# Patient Record
Sex: Female | Born: 1980 | Race: White | Hispanic: No | Marital: Married | Smoking: Never smoker
Health system: Southern US, Community
[De-identification: ages and names within clinical notes are randomized; demographics above are authoritative.]

## PROBLEM LIST (undated history)

## (undated) DIAGNOSIS — F419 Anxiety disorder, unspecified: Secondary | ICD-10-CM

## (undated) DIAGNOSIS — Z3490 Encounter for supervision of normal pregnancy, unspecified, unspecified trimester: Principal | ICD-10-CM

## (undated) DIAGNOSIS — I82819 Embolism and thrombosis of superficial veins of unspecified lower extremities: Secondary | ICD-10-CM

## (undated) DIAGNOSIS — N83209 Unspecified ovarian cyst, unspecified side: Secondary | ICD-10-CM

## (undated) DIAGNOSIS — R87619 Unspecified abnormal cytological findings in specimens from cervix uteri: Secondary | ICD-10-CM

## (undated) DIAGNOSIS — Z87440 Personal history of urinary (tract) infections: Secondary | ICD-10-CM

## (undated) DIAGNOSIS — IMO0002 Reserved for concepts with insufficient information to code with codable children: Secondary | ICD-10-CM

## (undated) DIAGNOSIS — O209 Hemorrhage in early pregnancy, unspecified: Secondary | ICD-10-CM

## (undated) DIAGNOSIS — N939 Abnormal uterine and vaginal bleeding, unspecified: Principal | ICD-10-CM

## (undated) DIAGNOSIS — N926 Irregular menstruation, unspecified: Secondary | ICD-10-CM

## (undated) HISTORY — DX: Personal history of urinary (tract) infections: Z87.440

## (undated) HISTORY — DX: Encounter for supervision of normal pregnancy, unspecified, unspecified trimester: Z34.90

## (undated) HISTORY — PX: CRYOTHERAPY: SHX1416

## (undated) HISTORY — DX: Unspecified abnormal cytological findings in specimens from cervix uteri: R87.619

## (undated) HISTORY — DX: Irregular menstruation, unspecified: N92.6

## (undated) HISTORY — DX: Embolism and thrombosis of superficial veins of unspecified lower extremity: I82.819

## (undated) HISTORY — DX: Abnormal uterine and vaginal bleeding, unspecified: N93.9

## (undated) HISTORY — DX: Anxiety disorder, unspecified: F41.9

## (undated) HISTORY — DX: Reserved for concepts with insufficient information to code with codable children: IMO0002

## (undated) HISTORY — DX: Hemorrhage in early pregnancy, unspecified: O20.9

## (undated) HISTORY — DX: Unspecified ovarian cyst, unspecified side: N83.209

## (undated) HISTORY — PX: TUBAL LIGATION: SHX77

---

## 2001-01-23 ENCOUNTER — Other Ambulatory Visit: Admission: RE | Admit: 2001-01-23 | Discharge: 2001-01-23 | Payer: Self-pay | Admitting: Obstetrics and Gynecology

## 2001-03-02 ENCOUNTER — Emergency Department (HOSPITAL_COMMUNITY): Admission: EM | Admit: 2001-03-02 | Discharge: 2001-03-02 | Payer: Self-pay | Admitting: Emergency Medicine

## 2001-12-28 ENCOUNTER — Ambulatory Visit (HOSPITAL_COMMUNITY): Admission: RE | Admit: 2001-12-28 | Discharge: 2001-12-28 | Payer: Self-pay | Admitting: Pulmonary Disease

## 2002-07-03 ENCOUNTER — Emergency Department (HOSPITAL_COMMUNITY): Admission: EM | Admit: 2002-07-03 | Discharge: 2002-07-03 | Payer: Self-pay | Admitting: Internal Medicine

## 2002-08-05 ENCOUNTER — Emergency Department (HOSPITAL_COMMUNITY): Admission: EM | Admit: 2002-08-05 | Discharge: 2002-08-05 | Payer: Self-pay | Admitting: *Deleted

## 2003-01-25 ENCOUNTER — Ambulatory Visit (HOSPITAL_COMMUNITY): Admission: RE | Admit: 2003-01-25 | Discharge: 2003-01-25 | Payer: Self-pay | Admitting: Obstetrics & Gynecology

## 2003-02-12 HISTORY — PX: DILATION AND CURETTAGE OF UTERUS: SHX78

## 2003-07-24 ENCOUNTER — Emergency Department (HOSPITAL_COMMUNITY): Admission: EM | Admit: 2003-07-24 | Discharge: 2003-07-24 | Payer: Self-pay | Admitting: Emergency Medicine

## 2003-07-25 ENCOUNTER — Other Ambulatory Visit: Admission: RE | Admit: 2003-07-25 | Discharge: 2003-07-25 | Payer: Self-pay | Admitting: Obstetrics & Gynecology

## 2004-04-30 ENCOUNTER — Ambulatory Visit (HOSPITAL_COMMUNITY): Admission: AD | Admit: 2004-04-30 | Discharge: 2004-04-30 | Payer: Self-pay | Admitting: Obstetrics and Gynecology

## 2004-06-01 ENCOUNTER — Ambulatory Visit (HOSPITAL_COMMUNITY): Admission: AD | Admit: 2004-06-01 | Discharge: 2004-06-01 | Payer: Self-pay | Admitting: Obstetrics and Gynecology

## 2004-07-03 ENCOUNTER — Observation Stay (HOSPITAL_COMMUNITY): Admission: AD | Admit: 2004-07-03 | Discharge: 2004-07-04 | Payer: Self-pay | Admitting: Obstetrics & Gynecology

## 2004-07-11 ENCOUNTER — Inpatient Hospital Stay (HOSPITAL_COMMUNITY): Admission: RE | Admit: 2004-07-11 | Discharge: 2004-07-13 | Payer: Self-pay | Admitting: Obstetrics & Gynecology

## 2009-12-15 ENCOUNTER — Other Ambulatory Visit: Admission: RE | Admit: 2009-12-15 | Discharge: 2009-12-15 | Payer: Self-pay | Admitting: Obstetrics and Gynecology

## 2010-02-11 DIAGNOSIS — N83209 Unspecified ovarian cyst, unspecified side: Secondary | ICD-10-CM

## 2010-02-11 HISTORY — DX: Unspecified ovarian cyst, unspecified side: N83.209

## 2010-05-24 ENCOUNTER — Other Ambulatory Visit (HOSPITAL_COMMUNITY)
Admission: RE | Admit: 2010-05-24 | Discharge: 2010-05-24 | Disposition: A | Discharge status: Home / Self Care | Payer: 59 | Source: Ambulatory Visit | Attending: Obstetrics and Gynecology | Admitting: Obstetrics and Gynecology

## 2010-05-24 DIAGNOSIS — Z01419 Encounter for gynecological examination (general) (routine) without abnormal findings: Secondary | ICD-10-CM | POA: Insufficient documentation

## 2010-06-29 NOTE — H&P (Signed)
NAME:  Kristin Buckley, Kristin Buckley                         ACCOUNT NO.:  [id number]   MEDICAL RECORD NO.:  [id number]                  PATIENT TYPE:   LOCATION:                                       FACILITY:   PHYSICIAN:  Lazaro Arms, M.D.                DATE OF BIRTH:  1980-07-01   DATE OF ADMISSION:  DATE OF DISCHARGE:                                HISTORY & PHYSICAL   HISTORY OF PRESENT ILLNESS:  Kristin Buckley is a 30 year old white female, now  gravida 2, para 0, abortus 1 with a missed AB diagnosed by sonogram and  following HCGs at approximately 7-1/2 weeks.  Kristin Buckley pregnancy had been  relatively uncomplicated to this point.  We had her on progesterone  suppositories for borderline progesterone level.  She came in with complaint  of just a minor amount of spotting on Friday.  Vaginal ultrasound revealed a  7-week 5-day crown/rump length with no fetal cardiac activity.  I got HCGs  on December 10th and 13th, and they went from 40,498 to just over 31,000.  As a result, this represents a missed AB. Kristin Buckley has not had any cramping or  bleeding since.   PAST MEDICAL HISTORY:  __________ medical problems negative.   PAST SURGICAL HISTORY:  Negative.   PAST OBSTETRICAL HISTORY:  She had an early first trimester loss earlier  this year.   REVIEW OF SYSTEMS:  Otherwise negative.   IMPRESSION:  First trimester missed abortion.   PLAN:  Cervical dilatation with suction and sharp curettage. The patient  understands the indications and will proceed.     ___________________________________________                                         Lazaro Arms, M.D.   [name]  D:  01/24/2003  T:  01/24/2003  Job:  [id number]

## 2010-06-29 NOTE — Op Note (Signed)
NAMESARRAH, FIORENZA NO.:  [id number]   MEDICAL RECORD NO.:  [id number]          PATIENT TYPE:  INP   LOCATION:  A402                          FACILITY:  [facility]   PHYSICIAN:  Lazaro Arms, M.D.   DATE OF BIRTH:  June 02, 1980   DATE OF PROCEDURE:  07/11/2004  DATE OF DISCHARGE:                                 OPERATIVE REPORT   PREOPERATIVE DIAGNOSES:  1.  Intrauterine pregnancy at 37-5/[redacted] weeks gestation.  2.  Double footling breech.  3.  Diminished amniotic fluid.   POSTOPERATIVE DIAGNOSES:  1.  Intrauterine pregnancy at 37-5/[redacted] weeks gestation.  2.  Double footling breech.  3.  Diminished amniotic fluid.   PROCEDURES:  Primary low transverse cesarean section with breech extraction.   SURGEON:  Lazaro Arms, M.D.   ANESTHESIA:  Spinal.   FINDINGS:  Over a low transverse hysterotomy incision was delivered a viable  female infant with Apgars of 9 and 9.  He was a double footling breech.  Great  care was taken with delivery of the pelvis, delivery of the shoulders and  delivery of the head to ensure no injury.  There was a three-vessel cord.  Cord blood and cord gas were sent.  The placenta was somewhat calcified.  It  was sent to pathology for evaluation.  The uterus, tubes and ovaries were  all normal.   DESCRIPTION OF PROCEDURE:  The patient was taken to the operating room and  placed in the sitting position.  She underwent a spinal anesthetic.  She was  placed in the supine position where she was prepped and draped in the usual  sterile fashion.  A Pfannenstiel skin incision was made and carried down  sharply to the rectus fascia, which was scored in the midline and extended  laterally.  The fascia was taken off of the muscle superiorly and inferiorly  without difficulty.  The muscles were divided.  The peritoneal cavity was  entered.  A bladder blade was placed.  A vesicouterine serosal flap was  created.  A low transverse hysterotomy incision was made.   Over this  incision was delivered a viable female, double footling breech extraction.  The legs were grasped at the ankles.  Fundal pressure was applied and  delivered to the pelvis.  I then pulled the baby by the pelvis around the  waist to the area of the shoulders.  The shoulders were then delivered by  flexing at the shoulder and elbow.  Then the head was delivered again by  flexion, by pressure and sort of flipping the baby.  The cord was clamped.  The infant was handed to Dr. Milford Cage, who was in attendance, for routine  neonatal resuscitation.  Cord blood and cord gas were sent.  The placenta  was delivered.  It was somewhat calcified and sent to pathology for  evaluation.  The uterus was exteriorized and closed in two layers, the first  being a running interlocking layer and the second being an imbricating  layer.  There was good hemostasis.  The uterus was replaced into the  peritoneal  cavity.  Both pericolic gutters were irrigated.  All pedicles  were hemostatic.  The muscles were reapproximated loosely.  The fascia was  closed using 0 Vicryl running  subcutaneous tissues, made hemostatic and irrigated.  The skin was closed  using skin staples.  The patient tolerated the procedure well.  She  experienced 750 mL of blood loss and was taken to recovery in good stable  condition.  All counts were correct.      LHE/MEDQ  D:  07/11/2004  T:  07/11/2004  Job:  [id number]

## 2010-06-29 NOTE — Op Note (Signed)
NAME:  Kristin Buckley, Kristin Buckley                         ACCOUNT NO.:  [id number]   MEDICAL RECORD NO.:  [id number]                   PATIENT TYPE:  AMB   LOCATION:  DAY                                  FACILITY:  [facility]   PHYSICIAN:  Lazaro Arms, M.D.                DATE OF BIRTH:  09-15-1980   DATE OF PROCEDURE:  01/25/2003  DATE OF DISCHARGE:                                 OPERATIVE REPORT   PREOPERATIVE DIAGNOSIS:  Missed abortion in the first trimester.   POSTOPERATIVE DIAGNOSIS:  Missed abortion in the first trimester.   PROCEDURE:  Cervical dilatation with a general suction and sharp uterine  curettage.   SURGEON:  Lazaro Arms, M.D.   ANESTHESIA:  Laryngeal mask airway.   FINDINGS:  The patient was found to have 7 week 5 day embryo with no fetal  cardiac activity and downward spiral hCG.  She was admitted for a D&C.   At the time of surgery, there were no unusual findings.   DESCRIPTION OF OPERATION:  The patient was taken to the operating room,  prepped and draped in the usual sterile fashion after being placed in  lithotomy position, 0.5% Marcaine was used a paracervical block.  Her cervix  was dilated serially to allow passage of an 8 French curved suction curette.  Three passes were made.  A gentle sharp curettage was performed.  One more  pass suction curette was performed.  No additional tissue was removed.  There was a good uterine cry in all areas.  The patient tolerated the  procedure well.  She was awakened from anesthesia and taken to the recovery  room in good stable condition.  All counts were correct.      ___________________________________________                                            Lazaro Arms, M.D.   LHE/MEDQ  D:  01/25/2003  T:  01/25/2003  Job:  [id number]

## 2010-06-29 NOTE — Discharge Summary (Signed)
Kristin Buckley, BANEY NO.:  [id number]   MEDICAL RECORD NO.:  [id number]          PATIENT TYPE:  INP   LOCATION:  A402                          FACILITY:  [facility]   PHYSICIAN:  Lazaro Arms, M.D.   DATE OF BIRTH:  01-18-1981   DATE OF ADMISSION:  07/11/2004  DATE OF DISCHARGE:  06/02/2006LH                                 DISCHARGE SUMMARY   DISCHARGE DIAGNOSES:  1.  Status post a primary cesarean section.  2.  Double Foley and breech.  3.  Unremarkable postoperative course.   PROCEDURES:  Primary C section with breech obstruction.   Please refer to the History and Physical, antepartum chart and operative  report for details of admission to the hospital.   HOSPITAL COURSE:  The patient was admitted after surgery.  She has done  quite well.  She was tolerated clear liquids and regular diet.  Voided  without symptoms.  She has been ambulatory and has return of normal bowel  function.  Her abdominal exam is benign.  Her incision is clean, dry and  intact.  She has remained afebrile, stable vital signs.  Her hemoglobin and  hematocrit on postoperative day #1 was 10 and 29 with a white count of 15.4.  Her incision is clean, dry and intact.  She is discharged to home to follow  up in the office next Tuesday to have her incision evaluated and staples  removed.  She was given Tylox and Motrin for pain and instructions and  precautions for return or contact in the office.      LHE/MEDQ  D:  07/13/2004  T:  07/13/2004  Job:  [id number]

## 2010-06-29 NOTE — Consult Note (Signed)
Kristin Buckley, Kristin Buckley NO.:  [id number]   MEDICAL RECORD NO.:  [id number]          PATIENT TYPE:  OBV   LOCATION:  LDR3                          FACILITY:  [facility]   PHYSICIAN:  Lazaro Arms, M.D.   DATE OF BIRTH:  July 18, 1980   DATE OF CONSULTATION:  DATE OF DISCHARGE:  07/04/2004                                   CONSULTATION   HISTORY OF PRESENT ILLNESS:  Kristin Buckley is a 30 year old white female, gravida 4,  para 0, abortus 3, with an estimated date of delivery of July 29, 2004,  currently at 36-3/[redacted] weeks gestation who was sent over to labor and delivery  because she was found to be breech in the office.  At the time she was found  to be breech, she had an amniotic fluid index of 6.3.  She was also  complaining of some blurred vision and increased swelling.  When she came to  labor and delivery, blood pressure was 151/75, and it quickly came down into  the 120s/70s to 80s range.  She had 2+ DTR's, no clonus, and she had 1+  edema.  Protein was negative in the office.  She was also complaining of  diminished fetal movement.  She came to labor and delivery, had a NST which  showed a reactive NST.  The patient was very concerned, very worried, and so  I decided to leave the patient overnight and do a biophysical profile the  next morning.  Biophysical profile, including the NST was 10/10, and the  amniotic fluid index was 7.3 cm.  Again, the baby was found to be breech,  and the blood pressure's came down even more to the next morning to the  120/70 range.  As a result, there is no evidence of any pre-eclampsia.  There is not oligohydramnios defined by an AFI of less than 5, or a vertical  pocket less than 2 cm.  We will probably plan to do a cesarean section next  week to prevent oligohydramnios from developing.  The placenta is also a  grade III by ultrasound.  The patient is discharged to home.  She is given  strict instructions and precautions, and will be seen later  this week to  have a followup NST.  At the present time, she is scheduled for cesarean  section next week for breech presentation.      LHE/MEDQ  D:  07/18/2004  T:  07/18/2004  Job:  [id number]

## 2011-04-16 ENCOUNTER — Other Ambulatory Visit: Payer: Self-pay

## 2011-04-16 ENCOUNTER — Encounter (HOSPITAL_COMMUNITY): Payer: Self-pay | Admitting: *Deleted

## 2011-04-16 ENCOUNTER — Emergency Department (HOSPITAL_COMMUNITY)
Admission: EM | Admit: 2011-04-16 | Discharge: 2011-04-16 | Disposition: A | Discharge status: Home / Self Care | Payer: 59 | Attending: Emergency Medicine | Admitting: Emergency Medicine

## 2011-04-16 DIAGNOSIS — K089 Disorder of teeth and supporting structures, unspecified: Secondary | ICD-10-CM | POA: Insufficient documentation

## 2011-04-16 DIAGNOSIS — M25519 Pain in unspecified shoulder: Secondary | ICD-10-CM | POA: Insufficient documentation

## 2011-04-16 DIAGNOSIS — F411 Generalized anxiety disorder: Secondary | ICD-10-CM | POA: Insufficient documentation

## 2011-04-16 DIAGNOSIS — R6884 Jaw pain: Secondary | ICD-10-CM | POA: Insufficient documentation

## 2011-04-16 DIAGNOSIS — K029 Dental caries, unspecified: Secondary | ICD-10-CM | POA: Insufficient documentation

## 2011-04-16 DIAGNOSIS — M79609 Pain in unspecified limb: Secondary | ICD-10-CM | POA: Insufficient documentation

## 2011-04-16 DIAGNOSIS — M25512 Pain in left shoulder: Secondary | ICD-10-CM

## 2011-04-16 DIAGNOSIS — R071 Chest pain on breathing: Secondary | ICD-10-CM | POA: Insufficient documentation

## 2011-04-16 MED ORDER — METHOCARBAMOL 500 MG PO TABS: ORAL_TABLET | ORAL | Status: AC

## 2011-04-16 MED ORDER — METHOCARBAMOL 500 MG PO TABS
1000.0000 mg | ORAL_TABLET | Freq: Once | ORAL | Status: DC
  Filled 2011-04-16: qty 2

## 2011-04-16 MED ORDER — PENICILLIN V POTASSIUM 250 MG PO TABS
500.0000 mg | ORAL_TABLET | Freq: Once | ORAL | Status: AC
  Administered 2011-04-16: 500 mg via ORAL
  Filled 2011-04-16: qty 2

## 2011-04-16 NOTE — ED Notes (Signed)
Pt is complaining of lt shoulder pain, lt lower jaw pain and lt ear ache.

## 2011-04-16 NOTE — ED Notes (Signed)
Pt reporting pain in left shoulder and arm, lasting 3 days.  Also reporting tooth on left lower side that she has been experiencing increased pain with.  Reports toothache is beginning to make jaw painful.

## 2011-04-16 NOTE — Discharge Instructions (Signed)
Continue the ibuprofen 600 mg every 6 hrs for pain with the robaxin. Keep your appointment with your dentis this morning at 9 am. He can decide if he wants you to remain on antibiotics. Try heat to relax your shoulder muscles (soak in a warm shower). Recheck as needed.

## 2011-04-16 NOTE — ED Notes (Signed)
Discharge instructions reviewed with pt, questions answered. Pt verbalized understanding.  

## 2011-04-16 NOTE — ED Provider Notes (Addendum)
History     CSN: [id number]  Arrival date & time 04/16/11  0200   First MD Initiated Contact with Patient 04/16/11 [phone]      Chief Complaint  Patient presents with  . Arm Pain  . Dental Pain    (Consider location/radiation/quality/duration/timing/severity/associated sxs/prior treatment) HPI  Patient relates her left shoulder has been aching for a few days. She denies any pain in her neck. She denies any injury that she is aware of. She states that it is an aching pain. She states it it seems to get worse at night. She states nothing makes it feel worse but stretching out her arm makes it feel better. She denies any numbness in her fingers. She states she's never had this before. She states nothing makes it feel better. She states stretching her arm out makes it feel better.She states she took 600 mg ibuprofen about 11 PM with minimal improvement. Patient denies shortness of breath or birth control pills.patient states she was lying in bed and got anxious as she was concerned with the jaw and arm pain that she was having a heart attack. She relates she was recently started on Cymbalta due to anxiety after her mother death in January 13, 2023.  Patient has a cavity on her left lower molar and she missed a dentist appointment last week because she was ill to have it fixed. She states yesterday she started having an achy underneath her left jaw. She states cold makes it feel worse. She denies any swelling of her gums or her jaw. She denies any fever. She has an appointment with  dentist at 9:00 this morning to get her tooth filled.  PCP Dr. Lilyan Punt  History reviewed. No pertinent past medical history.  Past Surgical History  Procedure Date  . Cesarean section     No family history on file.  History  Substance Use Topics  . Smoking status: Never Smoker   . Smokeless tobacco: Not on file  . Alcohol Use: No  employed as scrub tech  Lives with spouse  OB History    Grav Para Term  Preterm Abortions TAB SAB Ect Mult Living                  Review of Systems  All other systems reviewed and are negative.    Allergies  Review of patient's allergies indicates no known allergies.  Home Medications   Current Outpatient Rx  Name Route Sig Dispense Refill  . DULOXETINE HCL 20 MG PO CPEP Oral Take 20 mg by mouth daily.      BP 139/86  Pulse 71  Temp(Src) 98.7 F (37.1 C) (Oral)  Resp 20  Ht 5\' 11"  (1.803 m)  Wt 225 lb (102.059 kg)  BMI 31.38 kg/m2  SpO2 100%  LMP 04/16/2011  Vital signs normal    Physical Exam  Nursing note and vitals reviewed. Constitutional: She is oriented to person, place, and time. She appears well-developed and well-nourished.  Non-toxic appearance. She does not appear ill. No distress.  HENT:  Head: Normocephalic and atraumatic.  Right Ear: External ear normal.  Left Ear: External ear normal.  Nose: Nose normal. No mucosal edema or rhinorrhea.  Mouth/Throat: Oropharynx is clear and moist and mucous membranes are normal. No dental abscesses or uvula swelling.         She has a cavity on her left lower molar next to her wisdom tooth.  Eyes: Conjunctivae and EOM are normal. Pupils are equal, round, and  reactive to light.  Neck: Normal range of motion and full passive range of motion without pain. Neck supple.  Cardiovascular: Normal rate, regular rhythm and normal heart sounds.  Exam reveals no gallop and no friction rub.   No murmur heard. Pulmonary/Chest: Effort normal and breath sounds normal. No respiratory distress. She has no wheezes. She has no rhonchi. She has no rales. She exhibits no tenderness and no crepitus.  Abdominal: Soft. Normal appearance and bowel sounds are normal. She exhibits no distension. There is no tenderness. There is no rebound and no guarding.  Musculoskeletal: Normal range of motion. She exhibits no edema and no tenderness.       Moves all extremities well. Patient has no pain to abduction of her  left shoulder. She does not have a Phalen's or Tinel's sign. She does have some tenderness over the left upper trapezius and muscles in her left upper lateral chest wall.  Neurological: She is alert and oriented to person, place, and time. She has normal strength. No cranial nerve deficit.  Skin: Skin is warm, dry and intact. No rash noted. No erythema. No pallor.  Psychiatric: Her speech is normal and behavior is normal. Her mood appears not anxious.       Appears anxious    ED Course  Procedures (including critical care time)    Medications  methocarbamol (ROBAXIN) tablet 1,000 mg (not administered)  penicillin v potassium (VEETID) tablet 500 mg (500 mg Oral Given 04/16/11 0413)   Pt has a script for hydrocodone she hasn't filled. She also refuses tramadol. States she just wants to add the muscle relaxer to the ibuprofen.     Date: 04/16/2011  Rate: 69  Rhythm: normal sinus rhythm  QRS Axis: normal  Intervals: normal  ST/T Wave abnormalities: normal  Conduction Disutrbances:none  Narrative Interpretation:   Old EKG Reviewed: none available     1. Shoulder pain, left   2. Jaw pain   3. Dental caries     New Prescriptions   METHOCARBAMOL (ROBAXIN) 500 MG TABLET    Take 1 or 2 po QID for muscle soreness   Plan discharge Devoria Albe, MD, FACEP   MDM          Ward Givens, MD 04/16/11 4540  Ward Givens, MD 04/16/11 [phone]

## 2012-05-04 ENCOUNTER — Encounter: Payer: Self-pay | Admitting: Adult Health

## 2012-05-04 ENCOUNTER — Ambulatory Visit (INDEPENDENT_AMBULATORY_CARE_PROVIDER_SITE_OTHER): Payer: 59 | Admitting: Adult Health

## 2012-05-04 VITALS — BP 120/76 | Ht 71.0 in | Wt 230.0 lb

## 2012-05-04 DIAGNOSIS — R319 Hematuria, unspecified: Secondary | ICD-10-CM

## 2012-05-04 DIAGNOSIS — N390 Urinary tract infection, site not specified: Secondary | ICD-10-CM

## 2012-05-04 DIAGNOSIS — Z3202 Encounter for pregnancy test, result negative: Secondary | ICD-10-CM

## 2012-05-04 DIAGNOSIS — N926 Irregular menstruation, unspecified: Secondary | ICD-10-CM

## 2012-05-04 DIAGNOSIS — Z87898 Personal history of other specified conditions: Secondary | ICD-10-CM

## 2012-05-04 HISTORY — DX: Irregular menstruation, unspecified: N92.6

## 2012-05-04 LAB — POCT URINALYSIS DIPSTICK
Glucose, UA: NEGATIVE
Leukocytes, UA: NEGATIVE

## 2012-05-04 LAB — POCT URINE PREGNANCY: Preg Test, Ur: NEGATIVE

## 2012-05-04 MED ORDER — NITROFURANTOIN MONOHYD MACRO 100 MG PO CAPS: 100.0000 mg | ORAL_CAPSULE | Freq: Two times a day (BID) | ORAL | Status: DC

## 2012-05-04 NOTE — Progress Notes (Signed)
Subjective:     Patient ID: Kristin Buckley, female   DOB: [birth year], 32 y.o.   MRN: [id number]  HPI Kristin Buckley is a 32 year old white female, complaining of frequent urination, bloating and cramping, with a brown discharge x5 days. She says her periods have been irregular   Review of Systems Patient denies any headaches, blurred vision, shortness of breath, chest pain, abdominal pain, problems with bowel movements, or intercourse. She does have frequent urination and irregular periods.Denies any muscle pain. No depressed moods. Reviewed past medical, surgical social and family histories. Reviewed allergy and medication list.      Objective:   Physical ExamVital Signs: blood pressure 120/76, weight: 230lbs, Height: 5'11". LMP: 03/18/2012.Skin warm and dry, On pelvic exam external genitalia normal in appearance, the vagina is normal in appearance except scant brown discharge. Cervix is normal in apperance with negative CMT. Uterus felt to be normal size shape and contour, no adnexal masses or tenderness. Urine 1+ blood, negative urine pregnancy test.    Assessment:     Hematuria, UTI, Irregular menses.    Plan:     Prescribed Macrobid, increased fluids, period calendar, and call when ready to start trying to get pregnant.

## 2012-05-04 NOTE — Patient Instructions (Addendum)
Take macrobid, increase fluids, call me when ready to try to get pregnant, keep period calendar, sign up for mychart.

## 2012-05-06 ENCOUNTER — Telehealth: Payer: Self-pay | Admitting: Adult Health

## 2012-05-06 NOTE — Telephone Encounter (Signed)
Brownish discharge gone, feels crampy, try increasing fluids and call in am in follow up

## 2012-05-06 NOTE — Telephone Encounter (Signed)
Spoke with Pt. Was seen Monday and treated for UTI. On macrobid. Don't see any blood now in urine but feels crampy and bloated. +pressure. What do you advise?

## 2012-05-14 ENCOUNTER — Ambulatory Visit (INDEPENDENT_AMBULATORY_CARE_PROVIDER_SITE_OTHER): Payer: 59 | Admitting: Family Medicine

## 2012-05-14 ENCOUNTER — Encounter: Payer: Self-pay | Admitting: Family Medicine

## 2012-05-14 VITALS — BP 122/86 | HR 90 | Temp 99.0°F | Wt 232.0 lb

## 2012-05-14 DIAGNOSIS — L738 Other specified follicular disorders: Secondary | ICD-10-CM

## 2012-05-14 DIAGNOSIS — L739 Follicular disorder, unspecified: Secondary | ICD-10-CM

## 2012-05-14 MED ORDER — DOXYCYCLINE HYCLATE 100 MG PO CAPS: 100.0000 mg | ORAL_CAPSULE | Freq: Two times a day (BID) | ORAL | Status: DC

## 2012-05-14 MED ORDER — CLINDAMYCIN HCL 300 MG PO CAPS: 300.0000 mg | ORAL_CAPSULE | Freq: Three times a day (TID) | ORAL | Status: DC

## 2012-05-14 NOTE — Patient Instructions (Signed)
Use doxy twice a day Uses doxycycline twice a day. Take with a snack drink a tall glass of water afterwards. Also may use warm compresses several times a day to try to help speed the resolution. If any of these areas become an abscess he would need to call me. At this point in time there is nothing else that needs to be done.

## 2012-05-14 NOTE — Progress Notes (Signed)
  Subjective:    Patient ID: Kristin Buckley, female    DOB: March 26, 1980, 32 y.o.   MRN: [id number]  HPI patient is a rash underneath both arms. Patient denies any fever she has a small bump on her right arm that feels a little bit sore no other particular problems. There has been a family history of MRSA.    Review of Systemssee above.     Objective:   Physical Exam  Vital signs stable, folliculitis underneath both arms a small soft tissue nodule under the right arm no sign of abscess      Assessment & Plan:  Folliculitis/clindamycin 3 times a day for 10 days warm compresses when necessary followup of ongoing trouble.

## 2012-08-12 ENCOUNTER — Encounter: Payer: Self-pay | Admitting: Adult Health

## 2012-08-12 ENCOUNTER — Ambulatory Visit (INDEPENDENT_AMBULATORY_CARE_PROVIDER_SITE_OTHER): Payer: 59 | Admitting: Adult Health

## 2012-08-12 VITALS — BP 122/80 | Ht 71.0 in | Wt 228.0 lb

## 2012-08-12 DIAGNOSIS — R3915 Urgency of urination: Secondary | ICD-10-CM

## 2012-08-12 LAB — POCT URINALYSIS DIPSTICK
Blood, UA: NEGATIVE
Glucose, UA: NEGATIVE
Nitrite, UA: NEGATIVE
Protein, UA: NEGATIVE

## 2012-08-12 MED ORDER — NITROFURANTOIN MONOHYD MACRO 100 MG PO CAPS: 100.0000 mg | ORAL_CAPSULE | Freq: Two times a day (BID) | ORAL | Status: DC

## 2012-08-12 NOTE — Patient Instructions (Addendum)
Push fluids  call prn  Take macrobidUrinary Tract Infection Urinary tract infections (UTIs) can develop anywhere along your urinary tract. Your urinary tract is your body's drainage system for removing wastes and extra water. Your urinary tract includes two kidneys, two ureters, a bladder, and a urethra. Your kidneys are a pair of bean-shaped organs. Each kidney is about the size of your fist. They are located below your ribs, one on each side of your spine. CAUSES Infections are caused by microbes, which are microscopic organisms, including fungi, viruses, and bacteria. These organisms are so small that they can only be seen through a microscope. Bacteria are the microbes that most commonly cause UTIs. SYMPTOMS  Symptoms of UTIs may vary by age and gender of the patient and by the location of the infection. Symptoms in young women typically include a frequent and intense urge to urinate and a painful, burning feeling in the bladder or urethra during urination. Older women and men are more likely to be tired, shaky, and weak and have muscle aches and abdominal pain. A fever may mean the infection is in your kidneys. Other symptoms of a kidney infection include pain in your back or sides below the ribs, nausea, and vomiting. DIAGNOSIS To diagnose a UTI, your caregiver will ask you about your symptoms. Your caregiver also will ask to provide a urine sample. The urine sample will be tested for bacteria and white blood cells. White blood cells are made by your body to help fight infection. TREATMENT  Typically, UTIs can be treated with medication. Because most UTIs are caused by a bacterial infection, they usually can be treated with the use of antibiotics. The choice of antibiotic and length of treatment depend on your symptoms and the type of bacteria causing your infection. HOME CARE INSTRUCTIONS  If you were prescribed antibiotics, take them exactly as your caregiver instructs you. Finish the  medication even if you feel better after you have only taken some of the medication.  Drink enough water and fluids to keep your urine clear or pale yellow.  Avoid caffeine, tea, and carbonated beverages. They tend to irritate your bladder.  Empty your bladder often. Avoid holding urine for long periods of time.  Empty your bladder before and after sexual intercourse.  After a bowel movement, women should cleanse from front to back. Use each tissue only once. SEEK MEDICAL CARE IF:   You have back pain.  You develop a fever.  Your symptoms do not begin to resolve within 3 days. SEEK IMMEDIATE MEDICAL CARE IF:   You have severe back pain or lower abdominal pain.  You develop chills.  You have nausea or vomiting.  You have continued burning or discomfort with urination. MAKE SURE YOU:   Understand these instructions.  Will watch your condition.  Will get help right away if you are not doing well or get worse. Document Released: 11/07/2004 Document Revised: 07/30/2011 Document Reviewed: 03/08/2011 [facility] Patient Information 2014 [place], [place].

## 2012-08-12 NOTE — Progress Notes (Signed)
Subjective:     Patient ID: Kristin Buckley, female   DOB: Mar 05, 1980, 32 y.o.   MRN: [id number]  HPI Kristin Buckley is a 32 year old white female complaining of urinary urgency and freguency, no back pain.some cramps.Her LMP was 07/24/12 and she would like to be pregnant.  Review of Systems Positives in HPI Reviewed past medical,surgical, social and family history. Reviewed medications and allergies.     Objective:   Physical Exam BP 122/80  Ht 5\' 11"  (1.803 m)  Wt 228 lb (103.42 kg)  BMI 31.81 kg/m2  LMP 06/13/2014Urine dipstick negative.No back pain.     Assessment:     Urinary urgency and frequency     Plan:      Rx Macrobid 1 bid x 7 days Push fluids  Review handout on UTIs Call prn

## 2012-09-01 ENCOUNTER — Telehealth: Payer: Self-pay | Admitting: Adult Health

## 2012-09-01 NOTE — Telephone Encounter (Addendum)
Pt requesting medication to prevent period while on a trip. Pt states last period was 07/2012. Does not take BCP.

## 2012-09-01 NOTE — Telephone Encounter (Signed)
LMP 07/24/12 and is going away next week and does not want a period, it is too late to deter it.

## 2012-09-04 ENCOUNTER — Telehealth: Payer: Self-pay | Admitting: Family Medicine

## 2012-09-04 NOTE — Telephone Encounter (Signed)
Patient states that she is passing mucus when having a BM. Transferred patient to front desk to schedule appointment to be seen.

## 2012-09-04 NOTE — Telephone Encounter (Signed)
Wants a nurse to call her regarding some stomach issues.  Wants to know if what she has going on needs to be checked out now or if it can wait.  States it is not serve pain.  Issues when she goes to the bathroom.  Please call.  Thanks

## 2012-09-07 ENCOUNTER — Encounter: Payer: Self-pay | Admitting: Family Medicine

## 2012-09-07 ENCOUNTER — Ambulatory Visit (INDEPENDENT_AMBULATORY_CARE_PROVIDER_SITE_OTHER): Payer: 59 | Admitting: Family Medicine

## 2012-09-07 VITALS — BP 110/68 | Temp 98.9°F | Wt 226.0 lb

## 2012-09-07 DIAGNOSIS — R109 Unspecified abdominal pain: Secondary | ICD-10-CM

## 2012-09-07 LAB — POCT URINALYSIS DIPSTICK
Spec Grav, UA: 1.015
pH, UA: 6

## 2012-09-07 LAB — POCT URINE PREGNANCY: Preg Test, Ur: NEGATIVE

## 2012-09-07 MED ORDER — METRONIDAZOLE 500 MG PO TABS: 500.0000 mg | ORAL_TABLET | Freq: Three times a day (TID) | ORAL | Status: AC

## 2012-09-07 NOTE — Progress Notes (Signed)
  Subjective:    Patient ID: Kristin Buckley, female    DOB: [birth year], 32 y.o.   MRN: [id number]  Abdominal Pain This is a new problem. The current episode started in the past 7 days. The problem occurs daily. Associated symptoms comments: Mucus in stool. She has tried nothing for the symptoms.   patient relates that this came on out of the blue. Relate some intermittent abdominal discomforts. More in the left lower area along with some mucousy stools. Denies bloody stools. No sweats chills or high fevers. PMH benign. No recent travel no questionable water sources. No family history of premature colon cancer or Crohn's. Patient does not smoke not under excessive stress. Not bloody Lower abd pain, doesn't wake her up No dysuria No nausea, decreased appetite   Review of Systems  Gastrointestinal: Positive for abdominal pain.   see above. No wheezing difficulty breathing cough headaches fever chills or bloody stools. No joint pains or rash.     Objective:   Physical Exam Lungs are clear no crackles heart is regular abdomen is soft no guarding rebound or tenderness some lower abdominal sensations on the left side no masses are felt skin warm dry       Assessment & Plan:  Abdominal discomfort with mucus and stool-stool culture, Hemoccult x3, urine and urine pregnancy was negative. Flagyl 500 mg 3 times a day for 5 days for potential bacterial overgrowth, check C. difficile toxin test, also if persistent problems may need GI referral with sigmoidoscopy. 25 minutes spent with patient [phone]

## 2012-09-10 LAB — FECAL LACTOFERRIN, QUANT: Lactoferrin: NEGATIVE

## 2012-09-21 ENCOUNTER — Telehealth: Payer: Self-pay | Admitting: Adult Health

## 2012-09-21 NOTE — Telephone Encounter (Signed)
Pt states she has not had a period since June 14th. Had some cramping one day last week and some brownish discharge. Has had the brownish discharge for the past four days. UPT was negative. Not having any cramping at this time just still wiping the brownish discharge. No burning with urination either. Pt states it just isn't normal for her. Pt is trying to get pregnant. Pt is worried.

## 2012-09-21 NOTE — Telephone Encounter (Signed)
LMP in June 13 now spotting brown had negative pregnancy test at home,she has not come for progesterone level check yet, discussed that she is not ovulating, will discuss clomid at appt

## 2012-10-06 ENCOUNTER — Ambulatory Visit (INDEPENDENT_AMBULATORY_CARE_PROVIDER_SITE_OTHER): Payer: 59 | Admitting: Adult Health

## 2012-10-06 ENCOUNTER — Encounter: Payer: Self-pay | Admitting: Adult Health

## 2012-10-06 ENCOUNTER — Other Ambulatory Visit (HOSPITAL_COMMUNITY)
Admission: RE | Admit: 2012-10-06 | Discharge: 2012-10-06 | Disposition: A | Discharge status: Home / Self Care | Payer: 59 | Source: Ambulatory Visit | Attending: Adult Health | Admitting: Adult Health

## 2012-10-06 VITALS — BP 110/72 | HR 74 | Ht 71.0 in | Wt 230.0 lb

## 2012-10-06 DIAGNOSIS — Z01419 Encounter for gynecological examination (general) (routine) without abnormal findings: Secondary | ICD-10-CM

## 2012-10-06 DIAGNOSIS — N926 Irregular menstruation, unspecified: Secondary | ICD-10-CM

## 2012-10-06 DIAGNOSIS — Z1151 Encounter for screening for human papillomavirus (HPV): Secondary | ICD-10-CM | POA: Insufficient documentation

## 2012-10-06 NOTE — Progress Notes (Signed)
Patient ID: Kristin Buckley, female   DOB: 09-28-1980, 32 y.o.   MRN: [id number] History of Present Illness: Tylyn is a 32 year old white female married in for pap and physical.   Current Medications, Allergies, Past Medical History, Past Surgical History, Family History and Social History were reviewed in [employer] electronic medical record.     Review of Systems: Patient denies any headaches, blurred vision, shortness of breath, chest pain, abdominal pain, problems with bowel movements, urination, or intercourse. No joint pain or mood swings, she does have irregular cycles and wants to be pregnant in near future.    Physical Exam:BP 110/72  Pulse 74  Ht 5\' 11"  (1.803 m)  Wt 230 lb (104.327 kg)  BMI 32.09 kg/m2  LMP 09/18/2012 General:  Well developed, well nourished, no acute distress Skin:  Warm and dry,tan Neck:  Midline trachea, normal thyroid Lungs; Clear to auscultation bilaterally Breast:  No dominant palpable mass, retraction, or nipple discharge Cardiovascular: Regular rate and rhythm Abdomen:  Soft, non tender, no hepatosplenomegaly Pelvic:  External genitalia is normal in appearance.  The vagina is normal in appearance. 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  Uterus is felt to be normal size, shape, and contour.  No adnexal masses or tenderness noted. Extremities:  No swelling or varicosities noted Psych:  Alert and cooperative seems happy   Impression: Yearly exams Irregular cycles, desires pregnancy    Plan: Physical in 1 year Call with next menses will rx clomid if desired Take folic acid

## 2012-10-06 NOTE — Patient Instructions (Addendum)
Call with menses Physical in 1 year

## 2012-10-12 ENCOUNTER — Other Ambulatory Visit: Payer: Self-pay | Admitting: Family Medicine

## 2012-10-19 ENCOUNTER — Telehealth: Payer: Self-pay | Admitting: Obstetrics and Gynecology

## 2012-10-19 NOTE — Telephone Encounter (Signed)
Pt aware of pap results.

## 2012-10-28 ENCOUNTER — Telehealth: Payer: Self-pay | Admitting: Adult Health

## 2012-10-28 MED ORDER — CLOMIPHENE CITRATE 50 MG PO TABS: ORAL_TABLET | ORAL | Status: DC

## 2012-10-28 NOTE — Telephone Encounter (Signed)
Started period today wants clomid will rx and discussed timing of sex get progesterone level October 7.

## 2012-10-28 NOTE — Telephone Encounter (Signed)
Pt states she started period today. Pt states JAG was going to give her medication.

## 2012-10-30 ENCOUNTER — Telehealth: Payer: Self-pay | Admitting: Nurse Practitioner

## 2012-10-30 NOTE — Telephone Encounter (Signed)
Patient needs a refill of her phentermine to [facility]-Please call when complete.

## 2012-11-02 NOTE — Telephone Encounter (Signed)
Just saw request in my box today; Just to clarify: #1 she plans to hold off on pregnancy and try to lose weight first? Should not take Phentermine if she is trying to conceive; #2 has she been on it before, when and how long? Thanks.

## 2012-11-02 NOTE — Telephone Encounter (Signed)
Patient currently trying to get pregnant was given rx for clomid - filled it but is not going to take it so she just wants to lose weight to get pregnant naturally.  Clomid rxed on 10/28/12

## 2012-11-02 NOTE — Telephone Encounter (Signed)
Patient scheduled office visit to discuss 

## 2012-11-02 NOTE — Telephone Encounter (Signed)
Pt calling to check on her refill and wants to know if Kristin Buckley will give her a call back please

## 2012-11-09 ENCOUNTER — Telehealth: Payer: Self-pay | Admitting: Nurse Practitioner

## 2012-11-09 ENCOUNTER — Other Ambulatory Visit: Payer: Self-pay | Admitting: Nurse Practitioner

## 2012-11-09 ENCOUNTER — Encounter: Payer: Self-pay | Admitting: Family Medicine

## 2012-11-09 MED ORDER — PHENTERMINE HCL 37.5 MG PO TABS: 37.5000 mg | ORAL_TABLET | Freq: Every day | ORAL | Status: DC

## 2012-11-09 NOTE — Telephone Encounter (Signed)
Spoke with patient earlier. Brought her children in for sick visit. Tolerating Adipex without difficulty. Will not be able to come in next Thursday due to work.  Does not plan any pregnancy at this point.Is not taking fertility drugs. Will refill Phentermine, must have office visit before more refills.

## 2012-11-09 NOTE — Telephone Encounter (Signed)
Patient needs Rx for phentermine to [facility]

## 2012-11-09 NOTE — Telephone Encounter (Signed)
Has decided not to take Clomid. Not planning a pregnancy.  Advised patient not to get pregnant while on Phentermine. If accidental pregnancy, stop medication immmediately

## 2012-11-12 ENCOUNTER — Ambulatory Visit: Payer: 59 | Admitting: Nurse Practitioner

## 2012-11-16 ENCOUNTER — Other Ambulatory Visit: Payer: Self-pay | Admitting: Nurse Practitioner

## 2012-11-16 ENCOUNTER — Telehealth: Payer: Self-pay | Admitting: Family Medicine

## 2012-11-16 MED ORDER — TRIAMCINOLONE ACETONIDE 0.1 % EX CREA: TOPICAL_CREAM | Freq: Two times a day (BID) | CUTANEOUS | Status: DC

## 2012-11-16 NOTE — Telephone Encounter (Signed)
Patient says that she got a new face wash and is now broke out on her face with a really fine rash that is really itchy and eyes puffy. She would like to know if there is anything she can get OTC or if we can call something in for this.     [facility]

## 2012-12-15 ENCOUNTER — Ambulatory Visit: Payer: 59 | Admitting: Family Medicine

## 2012-12-17 ENCOUNTER — Encounter: Payer: Self-pay | Admitting: Family Medicine

## 2012-12-17 ENCOUNTER — Ambulatory Visit (INDEPENDENT_AMBULATORY_CARE_PROVIDER_SITE_OTHER): Payer: 59 | Admitting: Family Medicine

## 2012-12-17 VITALS — BP 130/70 | Temp 98.6°F | Ht 71.0 in | Wt 222.6 lb

## 2012-12-17 DIAGNOSIS — H6692 Otitis media, unspecified, left ear: Secondary | ICD-10-CM

## 2012-12-17 DIAGNOSIS — H6690 Otitis media, unspecified, unspecified ear: Secondary | ICD-10-CM

## 2012-12-17 DIAGNOSIS — J069 Acute upper respiratory infection, unspecified: Secondary | ICD-10-CM

## 2012-12-17 MED ORDER — LEVOFLOXACIN 500 MG PO TABS: 500.0000 mg | ORAL_TABLET | Freq: Every day | ORAL | Status: AC

## 2012-12-17 MED ORDER — BENZONATATE 100 MG PO CAPS: 100.0000 mg | ORAL_CAPSULE | Freq: Four times a day (QID) | ORAL | Status: DC | PRN

## 2012-12-17 NOTE — Progress Notes (Signed)
  Subjective:    Patient ID: Kristin Buckley, female    DOB: [birth year], 32 y.o.   MRN: [id number]  Cough This is a new problem. The current episode started in the past 7 days. Associated symptoms include headaches, myalgias and nasal congestion.   PMH benign progressive symptoms over the past several days initially had a viral illness, but better than it got worse progressive causing problems with coughing congestion. No other particular troubles.    Review of Systems  Respiratory: Positive for cough.   Musculoskeletal: Positive for myalgias.  Neurological: Positive for headaches.       Objective:   Physical Exam  Lungs are clear bronchial cough noted mild sinus tenderness left otitis noted throat is normal neck supple was put on amoxicillin by urgent care doctor earlier this week      Assessment & Plan:  Viral syndrome/URI/left otitis media/stop Amoxil/Levaquin 10 days/progressive symptoms call us.

## 2013-01-01 ENCOUNTER — Telehealth: Payer: Self-pay | Admitting: Nurse Practitioner

## 2013-01-01 NOTE — Telephone Encounter (Signed)
Telephone message on 11/09/12 - patient requested refill on Adipex and refill was given by Eber Jones but she also stated that patient would need office visit before any further.   Last office visit was on 12/17/12 but it was just for ear pain and URI.

## 2013-01-01 NOTE — Telephone Encounter (Signed)
Transferred patient to front desk to schedule appointment.  

## 2013-01-01 NOTE — Telephone Encounter (Signed)
Patient would like a refill for phentermine (ADIPEX-P) 37.5 MG tablet   [facility] in [place]

## 2013-01-01 NOTE — Telephone Encounter (Signed)
Needs bariatric visit

## 2013-01-09 ENCOUNTER — Encounter: Payer: Self-pay | Admitting: *Deleted

## 2013-01-12 ENCOUNTER — Encounter: Payer: Self-pay | Admitting: Family Medicine

## 2013-01-12 ENCOUNTER — Ambulatory Visit (INDEPENDENT_AMBULATORY_CARE_PROVIDER_SITE_OTHER): Payer: 59 | Admitting: Family Medicine

## 2013-01-12 VITALS — BP 128/86 | Ht 71.0 in | Wt 223.6 lb

## 2013-01-12 DIAGNOSIS — R5381 Other malaise: Secondary | ICD-10-CM

## 2013-01-12 MED ORDER — PHENTERMINE HCL 37.5 MG PO TABS: 37.5000 mg | ORAL_TABLET | Freq: Every day | ORAL | Status: DC

## 2013-01-12 NOTE — Patient Instructions (Addendum)
30 to 45 minutes eliptical for 4 to 5 times a week  Weight resistance 25 minutes 2 times a week  Journal your food  sparkpeople  calorieking

## 2013-01-12 NOTE — Progress Notes (Signed)
   Subjective:    Patient ID: Kristin Buckley, female    DOB: 17-Jul-1980, 32 y.o.   MRN: [id number]  HPI Patient arrives for a follow up on adipex. Patient does relate some fatigue not severe. She also tries her best to watch her diet she is trying to exercise recently got an elliptical. She states that she used to be thin but she is recently put on a fair amount await through some stressful life issues PMH benign denies any chest tightness pressure pain shortness of breath  Review of Systems     Objective:   Physical Exam Lungs clear pulse normal blood pressure good Ideally patient would lose down to 190      Assessment & Plan:  Fatigue-it is concerning that this patient has had some moderate weight gain and fatigue I don't feel she has sleep apnea she has had lab work in the recent past which looked reasonable. We talked at length about the importance of a structured dietary plan, regular physical exercise, in addition to this talked about the importance of expecting to lose all the way to immediately. She would like to try Adipex to see if that might help. The prescription was given 3 separate. One month at a time. She ought to followup in 3-6 months. Should also be noted that she states she is over the emotional stress that she was under from when her mom passed away.

## 2013-05-24 ENCOUNTER — Telehealth: Payer: Self-pay | Admitting: Adult Health

## 2013-05-24 NOTE — Telephone Encounter (Signed)
No answer on home number and cell ws having trouble connecting

## 2013-06-28 ENCOUNTER — Ambulatory Visit (INDEPENDENT_AMBULATORY_CARE_PROVIDER_SITE_OTHER): Payer: PRIVATE HEALTH INSURANCE | Admitting: Adult Health

## 2013-06-28 ENCOUNTER — Encounter: Payer: Self-pay | Admitting: Adult Health

## 2013-06-28 VITALS — BP 110/74 | Ht 71.0 in | Wt 209.0 lb

## 2013-06-28 DIAGNOSIS — Z3200 Encounter for pregnancy test, result unknown: Secondary | ICD-10-CM

## 2013-06-28 DIAGNOSIS — Z3201 Encounter for pregnancy test, result positive: Secondary | ICD-10-CM

## 2013-06-28 LAB — POCT URINE PREGNANCY: PREG TEST UR: POSITIVE

## 2013-06-29 ENCOUNTER — Telehealth: Payer: Self-pay | Admitting: Adult Health

## 2013-06-29 LAB — HCG, QUANTITATIVE, PREGNANCY: hCG, Beta Chain, Quant, S: 595.2 m[IU]/mL

## 2013-06-29 LAB — PROGESTERONE: Progesterone: 17.8 ng/mL

## 2013-06-29 NOTE — Telephone Encounter (Signed)
Pt aware of labs, will check QHCG in am and get US end of next week

## 2013-06-30 ENCOUNTER — Other Ambulatory Visit: Payer: PRIVATE HEALTH INSURANCE

## 2013-06-30 ENCOUNTER — Telehealth: Payer: Self-pay | Admitting: *Deleted

## 2013-06-30 DIAGNOSIS — Z3201 Encounter for pregnancy test, result positive: Secondary | ICD-10-CM

## 2013-06-30 NOTE — Telephone Encounter (Signed)
Message copied by Criss AlvinePULLIAM, Richrd Kuzniar G on Wed Jun 30, 2013  2:47 PM ------      Message from: LITTLE, DAWN H      Created: Wed Jun 30, 2013  2:21 PM      Contact: [phone]       I had to call the patient about her balance and she was asking if someone could please call her back with the results from her bloodwork this morning.  I told her that we would probably not get the results back until tomorrow at the earliest. ------

## 2013-06-30 NOTE — Telephone Encounter (Signed)
Pt informed results pending will call her with resulted when they are resulted.

## 2013-07-01 ENCOUNTER — Other Ambulatory Visit: Payer: Self-pay | Admitting: Adult Health

## 2013-07-01 ENCOUNTER — Telehealth: Payer: Self-pay | Admitting: Adult Health

## 2013-07-01 DIAGNOSIS — O3680X0 Pregnancy with inconclusive fetal viability, not applicable or unspecified: Secondary | ICD-10-CM

## 2013-07-01 DIAGNOSIS — O09299 Supervision of pregnancy with other poor reproductive or obstetric history, unspecified trimester: Secondary | ICD-10-CM

## 2013-07-01 LAB — HCG, QUANTITATIVE, PREGNANCY: hCG, Beta Chain, Quant, S: 1473.8 m[IU]/mL

## 2013-07-01 NOTE — Telephone Encounter (Signed)
Pt informed of QHCG results and to keep her appt for 07/08/2013.

## 2013-07-01 NOTE — Telephone Encounter (Signed)
Pt aware of labs  

## 2013-07-02 ENCOUNTER — Other Ambulatory Visit: Payer: Self-pay | Admitting: Adult Health

## 2013-07-02 ENCOUNTER — Encounter: Payer: Self-pay | Admitting: Adult Health

## 2013-07-02 ENCOUNTER — Other Ambulatory Visit: Payer: PRIVATE HEALTH INSURANCE

## 2013-07-02 ENCOUNTER — Ambulatory Visit (INDEPENDENT_AMBULATORY_CARE_PROVIDER_SITE_OTHER): Payer: PRIVATE HEALTH INSURANCE | Admitting: Adult Health

## 2013-07-02 ENCOUNTER — Ambulatory Visit (INDEPENDENT_AMBULATORY_CARE_PROVIDER_SITE_OTHER): Payer: PRIVATE HEALTH INSURANCE

## 2013-07-02 VITALS — BP 120/68 | Ht 71.0 in | Wt 211.0 lb

## 2013-07-02 DIAGNOSIS — O3680X0 Pregnancy with inconclusive fetal viability, not applicable or unspecified: Secondary | ICD-10-CM

## 2013-07-02 DIAGNOSIS — O209 Hemorrhage in early pregnancy, unspecified: Secondary | ICD-10-CM

## 2013-07-02 DIAGNOSIS — O09299 Supervision of pregnancy with other poor reproductive or obstetric history, unspecified trimester: Secondary | ICD-10-CM

## 2013-07-02 DIAGNOSIS — Z3490 Encounter for supervision of normal pregnancy, unspecified, unspecified trimester: Secondary | ICD-10-CM | POA: Insufficient documentation

## 2013-07-02 DIAGNOSIS — O200 Threatened abortion: Secondary | ICD-10-CM

## 2013-07-02 NOTE — Progress Notes (Signed)
U/S-transvaginal u/s performed, single intrauterine ?gestational sac noted within endometrium, Endom=16.81mm, GS meas c/w ~5 weeks, no YS or fetal pole noted on today's exam,  Bilateral adnexa appears wnl with C.L. Noted on the Rt no free fluid noted within the pelvis

## 2013-07-02 NOTE — Progress Notes (Signed)
Subjective:     Patient ID: Kristin Buckley, female   DOB: [birth year], 33 y.o.   MRN: [id number]  HPI Kristin Buckley is a 33 year old white female, married in for [place] for spotting in early pregnancy.Blood type 0+ per pt. Progesterone level 17.8 and last QHCG 1473.8.  Review of Systems See HPI Reviewed past medical,surgical, social and family history. Reviewed medications and allergies.     Objective:   Physical Exam BP 120/68  Ht 5\' 11"  (1.803 m)  Wt 211 lb (95.709 kg)  BMI 29.44 kg/m2  LMP 05/01/2013   Had [place] that show IUP with GS 4.7 mm and CL on right, no hemorrhage noted.  Assessment:     Pregnant  Bleeding in early pregnancy    Plan:     Check QHCG Follow up next week as scheduled for [place] No sex Review handout on first trimester and bleeding in early pregnancy

## 2013-07-02 NOTE — Patient Instructions (Signed)
Vaginal Bleeding During Pregnancy, First Trimester A small amount of bleeding (spotting) from the vagina is relatively common in early pregnancy. It usually stops on its own. Various things may cause bleeding or spotting in early pregnancy. Some bleeding may be related to the pregnancy, and some may not. In most cases, the bleeding is normal and is not a problem. However, bleeding can also be a sign of something serious. Be sure to tell your health care provider about any vaginal bleeding right away. Some possible causes of vaginal bleeding during the first trimester include:  Infection or inflammation of the cervix.  Growths (polyps) on the cervix.  Miscarriage or threatened miscarriage.  Pregnancy tissue has developed outside of the uterus and in a fallopian tube (tubal pregnancy).  Tiny cysts have developed in the uterus instead of pregnancy tissue (molar pregnancy). HOME CARE INSTRUCTIONS  Watch your condition for any changes. The following actions may help to lessen any discomfort you are feeling:  Follow your health care provider's instructions for limiting your activity. If your health care provider orders bed rest, you may need to stay in bed and only get up to use the bathroom. However, your health care provider may allow you to continue light activity.  If needed, make plans for someone to help with your regular activities and responsibilities while you are on bed rest.  Keep track of the number of pads you use each day, how often you change pads, and how soaked (saturated) they are. Write this down.  Do not use tampons. Do not douche.  Do not have sexual intercourse or orgasms until approved by your health care provider.  If you pass any tissue from your vagina, save the tissue so you can show it to your health care provider.  Only take over-the-counter or prescription medicines as directed by your health care provider.  Do not take aspirin because it can make you  bleed.  Keep all follow-up appointments as directed by your health care provider. SEEK MEDICAL CARE IF:  You have any vaginal bleeding during any part of your pregnancy.  You have cramps or labor pains. SEEK IMMEDIATE MEDICAL CARE IF:   You have severe cramps in your back or belly (abdomen).  You have a fever, not controlled by medicine.  You pass large clots or tissue from your vagina.  Your bleeding increases.  You feel lightheaded or weak, or you have fainting episodes.  You have chills.  You are leaking fluid or have a gush of fluid from your vagina.  You pass out while having a bowel movement. MAKE SURE YOU:  Understand these instructions.  Will watch your condition.  Will get help right away if you are not doing well or get worse. Document Released: 11/07/2004 Document Revised: 11/18/2012 Document Reviewed: 10/05/2012 [facility] Patient Information 2014 [place], [place]. Pregnancy - First Trimester During sexual intercourse, millions of sperm go into the vagina. Only 1 sperm will penetrate and fertilize the female egg while it is in the Fallopian tube. One week later, the fertilized egg implants into the wall of the uterus. An embryo begins to develop into a baby. At 6 to 8 weeks, the eyes and face are formed and the heartbeat can be seen on ultrasound. At the end of 12 weeks (first trimester), all the baby's organs are formed. Now that you are pregnant, you will want to do everything you can to have a healthy baby. Two of the most important things are to get good prenatal care  and follow your caregiver's instructions. Prenatal care is all the medical care you receive before the baby's birth. It is given to prevent, find, and treat problems during the pregnancy and childbirth. PRENATAL EXAMS  During prenatal visits, your weight, blood pressure, and urine are checked. This is done to make sure you are healthy and progressing normally during the pregnancy.  A pregnant woman  should gain 25 to 35 pounds during the pregnancy. However, if you are overweight or underweight, your caregiver will advise you regarding your weight.  Your caregiver will ask and answer questions for you.  Blood work, cervical cultures, other necessary tests, and a Pap test are done during your prenatal exams. These tests are done to check on your health and the probable health of your baby. Tests are strongly recommended and done for HIV with your permission. This is the virus that causes AIDS. These tests are done because medicines can be given to help prevent your baby from being born with this infection should you have been infected without knowing it. Blood work is also used to find out your blood type, previous infections, and follow your blood levels (hemoglobin).  Low hemoglobin (anemia) is common during pregnancy. Iron and vitamins are given to help prevent this. Later in the pregnancy, blood tests for diabetes will be done along with any other tests if any problems develop.  You may need other tests to make sure you and the baby are doing well. CHANGES DURING THE FIRST TRIMESTER  Your body goes through many changes during pregnancy. They vary from person to person. Talk to your caregiver about changes you notice and are concerned about. Changes can include:  Your menstrual period stops.  The egg and sperm carry the genes that determine what you look like. Genes from you and your partner are forming a baby. The female genes determine whether the baby is a boy or a girl.  Your body increases in girth and you may feel bloated.  Feeling sick to your stomach (nauseous) and throwing up (vomiting). If the vomiting is uncontrollable, call your caregiver.  Your breasts will begin to enlarge and become tender.  Your nipples may stick out more and become darker.  The need to urinate more. Painful urination may mean you have a bladder infection.  Tiring easily.  Loss of appetite.  Cravings  for certain kinds of food.  At first, you may gain or lose a couple of pounds.  You may have changes in your emotions from day to day (excited to be pregnant or concerned something may go wrong with the pregnancy and baby).  You may have more vivid and strange dreams. HOME CARE INSTRUCTIONS   It is very important to avoid all smoking, alcohol and non-prescribed drugs during your pregnancy. These affect the formation and growth of the baby. Avoid chemicals while pregnant to ensure the delivery of a healthy infant.  Start your prenatal visits by the 12th week of pregnancy. They are usually scheduled monthly at first, then more often in the last 2 months before delivery. Keep your caregiver's appointments. Follow your caregiver's instructions regarding medicine use, blood and lab tests, exercise, and diet.  During pregnancy, you are providing food for you and your baby. Eat regular, well-balanced meals. Choose foods such as meat, fish, milk and other low fat dairy products, vegetables, fruits, and whole-grain breads and cereals. Your caregiver will tell you of the ideal weight gain.  You can help morning sickness by keeping soda crackers  at the bedside. Eat a couple before arising in the morning. You may want to use the crackers without salt on them.  Eating 4 to 5 small meals rather than 3 large meals a day also may help the nausea and vomiting.  Drinking liquids between meals instead of during meals also seems to help nausea and vomiting.  A physical sexual relationship may be continued throughout pregnancy if there are no other problems. Problems may be early (premature) leaking of amniotic fluid from the membranes, vaginal bleeding, or belly (abdominal) pain.  Exercise regularly if there are no restrictions. Check with your caregiver or physical therapist if you are unsure of the safety of some of your exercises. Greater weight gain will occur in the last 2 trimesters of pregnancy. Exercising  will help:  Control your weight.  Keep you in shape.  Prepare you for labor and delivery.  Help you lose your pregnancy weight after you deliver your baby.  Wear a good support or jogging bra for breast tenderness during pregnancy. This may help if worn during sleep too.  Ask when prenatal classes are available. Begin classes when they are offered.  Do not use hot tubs, steam rooms, or saunas.  Wear your seat belt when driving. This protects you and your baby if you are in an accident.  Avoid raw meat, uncooked cheese, cat litter boxes, and soil used by cats throughout the pregnancy. These carry germs that can cause birth defects in the baby.  The first trimester is a good time to visit your dentist for your dental health. Getting your teeth cleaned is okay. Use a softer toothbrush and brush gently during pregnancy.  Ask for help if you have financial, counseling, or nutritional needs during pregnancy. Your caregiver will be able to offer counseling for these needs as well as refer you for other special needs.  Do not take any medicines or herbs unless told by your caregiver.  Inform your caregiver if there is any mental or physical domestic violence.  Make a list of emergency phone numbers of family, friends, hospital, and police and fire departments.  Write down your questions. Take them to your prenatal visit.  Do not douche.  Do not cross your legs.  If you have to stand for long periods of time, rotate you feet or take small steps in a circle.  You may have more vaginal secretions that may require a sanitary pad. Do not use tampons or scented sanitary pads. MEDICINES AND DRUG USE IN PREGNANCY  Take prenatal vitamins as directed. The vitamin should contain 1 milligram of folic acid. Keep all vitamins out of reach of children. Only a couple vitamins or tablets containing iron may be fatal to a baby or young child when ingested.  Avoid use of all medicines, including  herbs, over-the-counter medicines, not prescribed or suggested by your caregiver. Only take over-the-counter or prescription medicines for pain, discomfort, or fever as directed by your caregiver. Do not use aspirin, ibuprofen, or naproxen unless directed by your caregiver.  Let your caregiver also know about herbs you may be using.  Alcohol is related to a number of birth defects. This includes fetal alcohol syndrome. All alcohol, in any form, should be avoided completely. Smoking will cause low birth rate and premature babies.  Street or illegal drugs are very harmful to the baby. They are absolutely forbidden. A baby born to an addicted mother will be addicted at birth. The baby will go through the same withdrawal  an adult does.  Let your caregiver know about any medicines that you have to take and for what reason you take them. SEEK MEDICAL CARE IF:  You have any concerns or worries during your pregnancy. It is better to call with your questions if you feel they cannot wait, rather than worry about them. SEEK IMMEDIATE MEDICAL CARE IF:   An unexplained oral temperature above 102 F (38.9 C) develops, or as your caregiver suggests.  You have leaking of fluid from the vagina (birth canal). If leaking membranes are suspected, take your temperature and inform your caregiver of this when you call.  There is vaginal spotting or bleeding. Notify your caregiver of the amount and how many pads are used.  You develop a bad smelling vaginal discharge with a change in the color.  You continue to feel sick to your stomach (nauseated) and have no relief from remedies suggested. You vomit blood or coffee ground-like materials.  You lose more than 2 pounds of weight in 1 week.  You gain more than 2 pounds of weight in 1 week and you notice swelling of your face, hands, feet, or legs.  You gain 5 pounds or more in 1 week (even if you do not have swelling of your hands, face, legs, or feet).  You get  exposed to [place] measles and have never had them.  You are exposed to fifth disease or chickenpox.  You develop belly (abdominal) pain. Round ligament discomfort is a common non-cancerous (benign) cause of abdominal pain in pregnancy. Your caregiver still must evaluate this.  You develop headache, fever, diarrhea, pain with urination, or shortness of breath.  You fall or are in a car accident or have any kind of trauma.  There is mental or physical violence in your home. Document Released: 01/22/2001 Document Revised: 10/23/2011 Document Reviewed: 07/26/2008 [facility] Patient Information 2014 [place], [place]. NO SEX  Follow up as scheduled

## 2013-07-03 LAB — HCG, QUANTITATIVE, PREGNANCY: HCG, BETA CHAIN, QUANT, S: 3175.1 m[IU]/mL

## 2013-07-06 ENCOUNTER — Telehealth: Payer: Self-pay | Admitting: Adult Health

## 2013-07-06 NOTE — Telephone Encounter (Signed)
Pt aware QHCG more than doubled keep appt Thursday

## 2013-07-07 ENCOUNTER — Other Ambulatory Visit: Payer: Self-pay | Admitting: Adult Health

## 2013-07-07 DIAGNOSIS — O3680X0 Pregnancy with inconclusive fetal viability, not applicable or unspecified: Secondary | ICD-10-CM

## 2013-07-07 DIAGNOSIS — O09299 Supervision of pregnancy with other poor reproductive or obstetric history, unspecified trimester: Secondary | ICD-10-CM

## 2013-07-08 ENCOUNTER — Ambulatory Visit (INDEPENDENT_AMBULATORY_CARE_PROVIDER_SITE_OTHER): Payer: PRIVATE HEALTH INSURANCE

## 2013-07-08 ENCOUNTER — Other Ambulatory Visit: Payer: Self-pay | Admitting: Adult Health

## 2013-07-08 ENCOUNTER — Ambulatory Visit (INDEPENDENT_AMBULATORY_CARE_PROVIDER_SITE_OTHER): Payer: PRIVATE HEALTH INSURANCE | Admitting: Adult Health

## 2013-07-08 ENCOUNTER — Encounter: Payer: Self-pay | Admitting: Adult Health

## 2013-07-08 VITALS — BP 100/72 | Ht 71.0 in | Wt 211.0 lb

## 2013-07-08 DIAGNOSIS — O3680X0 Pregnancy with inconclusive fetal viability, not applicable or unspecified: Secondary | ICD-10-CM

## 2013-07-08 DIAGNOSIS — O09299 Supervision of pregnancy with other poor reproductive or obstetric history, unspecified trimester: Secondary | ICD-10-CM

## 2013-07-08 DIAGNOSIS — Z3480 Encounter for supervision of other normal pregnancy, unspecified trimester: Secondary | ICD-10-CM

## 2013-07-08 DIAGNOSIS — Z3490 Encounter for supervision of normal pregnancy, unspecified, unspecified trimester: Secondary | ICD-10-CM

## 2013-07-08 NOTE — Progress Notes (Signed)
Subjective:     Patient ID: Kristin Buckley, female   DOB: 08-28-1980, 33 y.o.   MRN: [id number]  HPI Kristin Buckley is a 33 year old white female, married in for [place] to monitor growth.  Review of Systems See HPI Reviewed past medical,surgical, social and family history. Reviewed medications and allergies.     Objective:   Physical Exam BP 100/72  Ht 5\' 11"  (1.803 m)  Wt 211 lb (95.709 kg)  BMI 29.44 kg/m2  LMP 03/21/2015reviewed [place] with pt.   Has 2.2 mm CRL =5+3 weeks +YS and GS bigger, has right CL, no bleeding or free fluid  Assessment:     Pregnant-early    Plan:     Follow up in 8 days for [place] and see me

## 2013-07-08 NOTE — Patient Instructions (Signed)
Follow up in 8 days for [place]

## 2013-07-08 NOTE — Progress Notes (Signed)
U/S-transvaginal u/s performed, single Intrauterine Gestational sac with +YS noted and fetal pole on today's exam, growth noted in GS measurements, cx appears closed, bilateral adnexa appears WNL with C.L. Noted on Rt, would like to  Reck for viability confirmation

## 2013-07-13 ENCOUNTER — Other Ambulatory Visit: Payer: Self-pay | Admitting: Obstetrics and Gynecology

## 2013-07-13 DIAGNOSIS — O3680X0 Pregnancy with inconclusive fetal viability, not applicable or unspecified: Secondary | ICD-10-CM

## 2013-07-13 DIAGNOSIS — O2620 Pregnancy care for patient with recurrent pregnancy loss, unspecified trimester: Secondary | ICD-10-CM

## 2013-07-16 ENCOUNTER — Ambulatory Visit (INDEPENDENT_AMBULATORY_CARE_PROVIDER_SITE_OTHER): Payer: PRIVATE HEALTH INSURANCE | Admitting: Adult Health

## 2013-07-16 ENCOUNTER — Encounter: Payer: Self-pay | Admitting: Adult Health

## 2013-07-16 ENCOUNTER — Other Ambulatory Visit: Payer: Self-pay | Admitting: Obstetrics and Gynecology

## 2013-07-16 ENCOUNTER — Ambulatory Visit (INDEPENDENT_AMBULATORY_CARE_PROVIDER_SITE_OTHER): Payer: PRIVATE HEALTH INSURANCE

## 2013-07-16 VITALS — BP 94/70 | Ht 71.0 in | Wt 208.0 lb

## 2013-07-16 DIAGNOSIS — Z1389 Encounter for screening for other disorder: Secondary | ICD-10-CM

## 2013-07-16 DIAGNOSIS — Z331 Pregnant state, incidental: Secondary | ICD-10-CM

## 2013-07-16 DIAGNOSIS — O2620 Pregnancy care for patient with recurrent pregnancy loss, unspecified trimester: Secondary | ICD-10-CM

## 2013-07-16 DIAGNOSIS — Z3490 Encounter for supervision of normal pregnancy, unspecified, unspecified trimester: Secondary | ICD-10-CM

## 2013-07-16 DIAGNOSIS — O3680X0 Pregnancy with inconclusive fetal viability, not applicable or unspecified: Secondary | ICD-10-CM

## 2013-07-16 DIAGNOSIS — Z3480 Encounter for supervision of other normal pregnancy, unspecified trimester: Secondary | ICD-10-CM

## 2013-07-16 NOTE — Patient Instructions (Signed)
Follow up in 12 days for US and new OB Pregnancy - First Trimester During sexual intercourse, millions of sperm go into the vagina. Only 1 sperm will penetrate and fertilize the female egg while it is in the Fallopian tube. One week later, the fertilized egg implants into the wall of the uterus. An embryo begins to develop into a baby. At 6 to 8 weeks, the eyes and face are formed and the heartbeat can be seen on ultrasound. At the end of 12 weeks (first trimester), all the baby's organs are formed. Now that you are pregnant, you will want to do everything you can to have a healthy baby. Two of the most important things are to get good prenatal care and follow your caregiver's instructions. Prenatal care is all the medical care you receive before the baby's birth. It is given to prevent, find, and treat problems during the pregnancy and childbirth. PRENATAL EXAMS  During prenatal visits, your weight, blood pressure, and urine are checked. This is done to make sure you are healthy and progressing normally during the pregnancy.  A pregnant woman should gain 25 to 35 pounds during the pregnancy. However, if you are overweight or underweight, your caregiver will advise you regarding your weight.  Your caregiver will ask and answer questions for you.  Blood work, cervical cultures, other necessary tests, and a Pap test are done during your prenatal exams. These tests are done to check on your health and the probable health of your baby. Tests are strongly recommended and done for HIV with your permission. This is the virus that causes AIDS. These tests are done because medicines can be given to help prevent your baby from being born with this infection should you have been infected without knowing it. Blood work is also used to find out your blood type, previous infections, and follow your blood levels (hemoglobin).  Low hemoglobin (anemia) is common during pregnancy. Iron and vitamins are given to help  prevent this. Later in the pregnancy, blood tests for diabetes will be done along with any other tests if any problems develop.  You may need other tests to make sure you and the baby are doing well. CHANGES DURING THE FIRST TRIMESTER  Your body goes through many changes during pregnancy. They vary from person to person. Talk to your caregiver about changes you notice and are concerned about. Changes can include:  Your menstrual period stops.  The egg and sperm carry the genes that determine what you look like. Genes from you and your partner are forming a baby. The female genes determine whether the baby is a boy or a girl.  Your body increases in girth and you may feel bloated.  Feeling sick to your stomach (nauseous) and throwing up (vomiting). If the vomiting is uncontrollable, call your caregiver.  Your breasts will begin to enlarge and become tender.  Your nipples may stick out more and become darker.  The need to urinate more. Painful urination may mean you have a bladder infection.  Tiring easily.  Loss of appetite.  Cravings for certain kinds of food.  At first, you may gain or lose a couple of pounds.  You may have changes in your emotions from day to day (excited to be pregnant or concerned something may go wrong with the pregnancy and baby).  You may have more vivid and strange dreams. HOME CARE INSTRUCTIONS   It is very important to avoid all smoking, alcohol and non-prescribed drugs during your  pregnancy. These affect the formation and growth of the baby. Avoid chemicals while pregnant to ensure the delivery of a healthy infant.  Start your prenatal visits by the 12th week of pregnancy. They are usually scheduled monthly at first, then more often in the last 2 months before delivery. Keep your caregiver's appointments. Follow your caregiver's instructions regarding medicine use, blood and lab tests, exercise, and diet.  During pregnancy, you are providing food for you  and your baby. Eat regular, well-balanced meals. Choose foods such as meat, fish, milk and other low fat dairy products, vegetables, fruits, and whole-grain breads and cereals. Your caregiver will tell you of the ideal weight gain.  You can help morning sickness by keeping soda crackers at the bedside. Eat a couple before arising in the morning. You may want to use the crackers without salt on them.  Eating 4 to 5 small meals rather than 3 large meals a day also may help the nausea and vomiting.  Drinking liquids between meals instead of during meals also seems to help nausea and vomiting.  A physical sexual relationship may be continued throughout pregnancy if there are no other problems. Problems may be early (premature) leaking of amniotic fluid from the membranes, vaginal bleeding, or belly (abdominal) pain.  Exercise regularly if there are no restrictions. Check with your caregiver or physical therapist if you are unsure of the safety of some of your exercises. Greater weight gain will occur in the last 2 trimesters of pregnancy. Exercising will help:  Control your weight.  Keep you in shape.  Prepare you for labor and delivery.  Help you lose your pregnancy weight after you deliver your baby.  Wear a good support or jogging bra for breast tenderness during pregnancy. This may help if worn during sleep too.  Ask when prenatal classes are available. Begin classes when they are offered.  Do not use hot tubs, steam rooms, or saunas.  Wear your seat belt when driving. This protects you and your baby if you are in an accident.  Avoid raw meat, uncooked cheese, cat litter boxes, and soil used by cats throughout the pregnancy. These carry germs that can cause birth defects in the baby.  The first trimester is a good time to visit your dentist for your dental health. Getting your teeth cleaned is okay. Use a softer toothbrush and brush gently during pregnancy.  Ask for help if you have  financial, counseling, or nutritional needs during pregnancy. Your caregiver will be able to offer counseling for these needs as well as refer you for other special needs.  Do not take any medicines or herbs unless told by your caregiver.  Inform your caregiver if there is any mental or physical domestic violence.  Make a list of emergency phone numbers of family, friends, hospital, and police and fire departments.  Write down your questions. Take them to your prenatal visit.  Do not douche.  Do not cross your legs.  If you have to stand for long periods of time, rotate you feet or take small steps in a circle.  You may have more vaginal secretions that may require a sanitary pad. Do not use tampons or scented sanitary pads. MEDICINES AND DRUG USE IN PREGNANCY  Take prenatal vitamins as directed. The vitamin should contain 1 milligram of folic acid. Keep all vitamins out of reach of children. Only a couple vitamins or tablets containing iron may be fatal to a baby or young child when ingested.  Avoid use of all medicines, including herbs, over-the-counter medicines, not prescribed or suggested by your caregiver. Only take over-the-counter or prescription medicines for pain, discomfort, or fever as directed by your caregiver. Do not use aspirin, ibuprofen, or naproxen unless directed by your caregiver.  Let your caregiver also know about herbs you may be using.  Alcohol is related to a number of birth defects. This includes fetal alcohol syndrome. All alcohol, in any form, should be avoided completely. Smoking will cause low birth rate and premature babies.  Street or illegal drugs are very harmful to the baby. They are absolutely forbidden. A baby born to an addicted mother will be addicted at birth. The baby will go through the same withdrawal an adult does.  Let your caregiver know about any medicines that you have to take and for what reason you take them. SEEK MEDICAL CARE IF:  You  have any concerns or worries during your pregnancy. It is better to call with your questions if you feel they cannot wait, rather than worry about them. SEEK IMMEDIATE MEDICAL CARE IF:   An unexplained oral temperature above 102 F (38.9 C) develops, or as your caregiver suggests.  You have leaking of fluid from the vagina (birth canal). If leaking membranes are suspected, take your temperature and inform your caregiver of this when you call.  There is vaginal spotting or bleeding. Notify your caregiver of the amount and how many pads are used.  You develop a bad smelling vaginal discharge with a change in the color.  You continue to feel sick to your stomach (nauseated) and have no relief from remedies suggested. You vomit blood or coffee ground-like materials.  You lose more than 2 pounds of weight in 1 week.  You gain more than 2 pounds of weight in 1 week and you notice swelling of your face, hands, feet, or legs.  You gain 5 pounds or more in 1 week (even if you do not have swelling of your hands, face, legs, or feet).  You get exposed to [place] measles and have never had them.  You are exposed to fifth disease or chickenpox.  You develop belly (abdominal) pain. Round ligament discomfort is a common non-cancerous (benign) cause of abdominal pain in pregnancy. Your caregiver still must evaluate this.  You develop headache, fever, diarrhea, pain with urination, or shortness of breath.  You fall or are in a car accident or have any kind of trauma.  There is mental or physical violence in your home. Document Released: 01/22/2001 Document Revised: 10/23/2011 Document Reviewed: 07/26/2008 [facility] Patient Information 2014 [place].

## 2013-07-16 NOTE — Progress Notes (Signed)
Subjective:     Patient ID: Kristin Buckley, female   DOB: 03/30/1980, 33 y.o.   MRN: [id number]  HPI Kristin Buckley is a 33 year old white female, married in for [place] for dating and growth, No more bleeding noted.  Review of Systems See HPI Reviewed past medical,surgical, social and family history. Reviewed medications and allergies.     Objective:   Physical Exam BP 94/70  Ht 5\' 11"  (1.803 m)  Wt 208 lb (94.348 kg)  BMI 29.02 kg/m2  LMP 05/01/2013   Reviewed [place], has IUP with 3.8 mm CRL and FCM of 102 EDC 03/12/14  Assessment:    Pregnant    Plan:     Return in about 12 days for [place] and new OB   Review handout on first trimester

## 2013-07-16 NOTE — Progress Notes (Signed)
U/S-transvaginal u/s performed, single IUP with +FCA noted, FHR-102 bpm, CRL c/w 5+6wks (4.49mm). cx appears closed, bilateral adnexa appears WNL, no free fluid or adnexal masses noted within the pelvis

## 2013-07-19 ENCOUNTER — Ambulatory Visit: Payer: PRIVATE HEALTH INSURANCE | Admitting: Adult Health

## 2013-07-19 ENCOUNTER — Other Ambulatory Visit: Payer: PRIVATE HEALTH INSURANCE

## 2013-07-20 ENCOUNTER — Telehealth: Payer: Self-pay | Admitting: *Deleted

## 2013-07-20 NOTE — Telephone Encounter (Signed)
Pt states that over the weekend she had some lit spotting and brownish discharge yesterday. Pt states that she is having a brownish, pink discharge today. Pt states that she has been having some cramping but not terrible. I spoke with JAG and she advised the pt to push fluids and rest and let us know if anything changes. The pt was advised of this and verbalized understanding.

## 2013-07-22 ENCOUNTER — Encounter: Payer: Self-pay | Admitting: Adult Health

## 2013-07-22 ENCOUNTER — Other Ambulatory Visit: Payer: Self-pay | Admitting: Adult Health

## 2013-07-22 ENCOUNTER — Ambulatory Visit (INDEPENDENT_AMBULATORY_CARE_PROVIDER_SITE_OTHER): Payer: PRIVATE HEALTH INSURANCE

## 2013-07-22 ENCOUNTER — Ambulatory Visit (INDEPENDENT_AMBULATORY_CARE_PROVIDER_SITE_OTHER): Payer: PRIVATE HEALTH INSURANCE | Admitting: Adult Health

## 2013-07-22 VITALS — BP 108/70 | Ht 71.0 in | Wt 208.0 lb

## 2013-07-22 DIAGNOSIS — O200 Threatened abortion: Secondary | ICD-10-CM

## 2013-07-22 DIAGNOSIS — O209 Hemorrhage in early pregnancy, unspecified: Secondary | ICD-10-CM

## 2013-07-22 NOTE — Patient Instructions (Signed)
Pelvic rest return as scheduled

## 2013-07-22 NOTE — Progress Notes (Signed)
Subjective:     Patient ID: Kristin Buckley, female   DOB: 13-Aug-1980, 33 y.o.   MRN: [id number]  HPI Kristin Buckley is a 33 year old white female,married in complaining of wiping brown and some clots, will get [place] today.  Review of Systems See HPI Reviewed past medical,surgical, social and family history. Reviewed medications and allergies.     Objective:   Physical Exam BP 108/70  Ht 5\' 11"  (1.803 m)  Wt 208 lb (94.348 kg)  BMI 29.02 kg/m2  LMP 05/01/2013   [place] reviewed with pt and husband, has +IUP about 6+1 week with FHR of 99-100 and small SCH noted about 2-3 mm.Has right CL. And cervix is closed.Discussed with Dr Despina Hidden. Pt aware that she is early and that pelvic rest is about that we can do at present.  Assessment:     Bleeding in early prenancy    Plan:     Pelvic rest, no sex or straining or heavy lifting Return next week as scheduled

## 2013-07-22 NOTE — Progress Notes (Signed)
U/S(6+5wks)-single IUP with +FCA noted, FHR- 100 & 99 bpm, cx appears closed, bilateral adnexa appears WNL with C.L. Noted on the RT, CRL meas c/w 6+1wks, Maternal HR-84bpm, small Sub-Chorionic Hemorrhage noted adjacent to GS

## 2013-07-26 ENCOUNTER — Other Ambulatory Visit: Payer: Self-pay | Admitting: Adult Health

## 2013-07-26 DIAGNOSIS — O200 Threatened abortion: Secondary | ICD-10-CM

## 2013-07-26 DIAGNOSIS — O2620 Pregnancy care for patient with recurrent pregnancy loss, unspecified trimester: Secondary | ICD-10-CM

## 2013-07-28 ENCOUNTER — Encounter: Payer: Self-pay | Admitting: Adult Health

## 2013-07-28 ENCOUNTER — Encounter: Payer: PRIVATE HEALTH INSURANCE | Admitting: Adult Health

## 2013-07-28 ENCOUNTER — Ambulatory Visit (INDEPENDENT_AMBULATORY_CARE_PROVIDER_SITE_OTHER): Payer: PRIVATE HEALTH INSURANCE

## 2013-07-28 ENCOUNTER — Other Ambulatory Visit: Payer: Self-pay | Admitting: Adult Health

## 2013-07-28 ENCOUNTER — Ambulatory Visit (INDEPENDENT_AMBULATORY_CARE_PROVIDER_SITE_OTHER): Payer: PRIVATE HEALTH INSURANCE | Admitting: Adult Health

## 2013-07-28 VITALS — BP 128/80 | Ht 71.0 in | Wt 210.0 lb

## 2013-07-28 DIAGNOSIS — O039 Complete or unspecified spontaneous abortion without complication: Secondary | ICD-10-CM | POA: Insufficient documentation

## 2013-07-28 DIAGNOSIS — O021 Missed abortion: Secondary | ICD-10-CM

## 2013-07-28 DIAGNOSIS — O200 Threatened abortion: Secondary | ICD-10-CM

## 2013-07-28 DIAGNOSIS — O2620 Pregnancy care for patient with recurrent pregnancy loss, unspecified trimester: Secondary | ICD-10-CM

## 2013-07-28 NOTE — Progress Notes (Signed)
U/S(7+4wks)-transvaginal u/s performed, single IUP non-viable no FCA noted, CRL c/w 6+1wks (5.634mm - no growth noted since 07/22/2013 u./s), cx appears closed, bilateral adnexa appears WNL, no free fluid or adnexal masses noted within the pelvis

## 2013-07-28 NOTE — Patient Instructions (Signed)
Miscarriage A miscarriage is the sudden loss of an unborn baby (fetus) before the 20th week of pregnancy. Most miscarriages happen in the first 3 months of pregnancy. Sometimes, it happens before a woman even knows she is pregnant. A miscarriage is also called a "spontaneous miscarriage" or "early pregnancy loss." Having a miscarriage can be an emotional experience. Talk with your caregiver about any questions you may have about miscarrying, the grieving process, and your future pregnancy plans. CAUSES   Problems with the fetal chromosomes that make it impossible for the baby to develop normally. Problems with the baby's genes or chromosomes are most often the result of errors that occur, by chance, as the embryo divides and grows. The problems are not inherited from the parents.  Infection of the cervix or uterus.   Hormone problems.   Problems with the cervix, such as having an incompetent cervix. This is when the tissue in the cervix is not strong enough to hold the pregnancy.   Problems with the uterus, such as an abnormally shaped uterus, uterine fibroids, or congenital abnormalities.   Certain medical conditions.   Smoking, drinking alcohol, or taking illegal drugs.   Trauma.  Often, the cause of a miscarriage is unknown.  SYMPTOMS   Vaginal bleeding or spotting, with or without cramps or pain.  Pain or cramping in the abdomen or lower back.  Passing fluid, tissue, or blood clots from the vagina. DIAGNOSIS  Your caregiver will perform a physical exam. You may also have an ultrasound to confirm the miscarriage. Blood or urine tests may also be ordered. TREATMENT   Sometimes, treatment is not necessary if you naturally pass all the fetal tissue that was in the uterus. If some of the fetus or placenta remains in the body (incomplete miscarriage), tissue left behind may become infected and must be removed. Usually, a dilation and curettage (D and C) procedure is performed.  During a D and C procedure, the cervix is widened (dilated) and any remaining fetal or placental tissue is gently removed from the uterus.  Antibiotic medicines are prescribed if there is an infection. Other medicines may be given to reduce the size of the uterus (contract) if there is a lot of bleeding.  If you have Rh negative blood and your baby was Rh positive, you will need a Rh immunoglobulin shot. This shot will protect any future baby from having Rh blood problems in future pregnancies. HOME CARE INSTRUCTIONS   Your caregiver may order bed rest or may allow you to continue light activity. Resume activity as directed by your caregiver.  Have someone help with home and family responsibilities during this time.   Keep track of the number of sanitary pads you use each day and how soaked (saturated) they are. Write down this information.   Do not use tampons. Do not douche or have sexual intercourse until approved by your caregiver.   Only take over-the-counter or prescription medicines for pain or discomfort as directed by your caregiver.   Do not take aspirin. Aspirin can cause bleeding.   Keep all follow-up appointments with your caregiver.   If you or your partner have problems with grieving, talk to your caregiver or seek counseling to help cope with the pregnancy loss. Allow enough time to grieve before trying to get pregnant again.  SEEK IMMEDIATE MEDICAL CARE IF:   You have severe cramps or pain in your back or abdomen.  You have a fever.  You pass large blood clots (walnut-sized   or larger) ortissue from your vagina. Save any tissue for your caregiver to inspect.   Your bleeding increases.   You have a thick, bad-smelling vaginal discharge.  You become lightheaded, weak, or you faint.   You have chills.  MAKE SURE YOU:  Understand these instructions.  Will watch your condition.  Will get help right away if you are not doing well or get  worse. Document Released: 07/24/2000 Document Revised: 05/25/2012 Document Reviewed: 03/19/2011 [facility] Patient Information 2015 [place]. This information is not intended to replace advice given to you by your health care provider. Make sure you discuss any questions you have with your health care provider. Pt to call

## 2013-07-28 NOTE — Progress Notes (Signed)
Subjective:     Patient ID: Kristin Buckley, female   DOB: May 22, 1980, 33 y.o.   MRN: [id number]  HPI Kristin Buckley is a 33 year old white female in for US to monitor growth.  Review of Systems See HPI Reviewed past medical,surgical, social and family history. Reviewed medications and allergies.     Objective:   Physical Exam BP 128/80  Ht 5\' 11"  (1.803 m)  Wt 210 lb (95.255 kg)  BMI 29.30 kg/m2  LMP 05/01/2013  Breastfeeding? UnknownUS reviewed with pt: shows no growth CRL 5.4 mm and NO FCA, pt understands this, told to talk with husband then make plan of care decision, blood type O+, spots when wipes only.    Assessment:     Miscarriage    Plan:     Review handout on miscarriage Talk with husband and decide if she wants to follow, take cytotec or get follow up US, pt to call me back

## 2013-07-29 ENCOUNTER — Telehealth: Payer: Self-pay | Admitting: Adult Health

## 2013-07-29 NOTE — Telephone Encounter (Signed)
Kristin CampanileSandy called this am saying she is blooding more with cramping, will let her body proceed, will see in 2 weeks in follow up and [facility]

## 2013-07-30 ENCOUNTER — Telehealth: Payer: Self-pay | Admitting: *Deleted

## 2013-07-30 MED ORDER — HYDROCODONE-ACETAMINOPHEN 10-325 MG PO TABS
1.0000 | ORAL_TABLET | Freq: Four times a day (QID) | ORAL | Status: DC | PRN
Start: 2013-07-30 — End: 2014-07-06

## 2013-07-30 NOTE — Telephone Encounter (Signed)
Will rx norco 10/325 #30 no refills

## 2013-07-30 NOTE — Telephone Encounter (Signed)
Pt states saw Cyril MourningJennifer Griffin, NP for a miscarriage, now having severe cramping with clots. Pt requesting medication for pain.

## 2013-07-31 ENCOUNTER — Emergency Department (HOSPITAL_COMMUNITY)
Admission: EM | Admit: 2013-07-31 | Discharge: 2013-07-31 | Disposition: A | Discharge status: Home / Self Care | Payer: PRIVATE HEALTH INSURANCE | Attending: Emergency Medicine | Admitting: Emergency Medicine

## 2013-07-31 ENCOUNTER — Encounter (HOSPITAL_COMMUNITY): Payer: Self-pay | Admitting: Emergency Medicine

## 2013-07-31 ENCOUNTER — Emergency Department (HOSPITAL_COMMUNITY): Payer: PRIVATE HEALTH INSURANCE

## 2013-07-31 DIAGNOSIS — Z8659 Personal history of other mental and behavioral disorders: Secondary | ICD-10-CM | POA: Insufficient documentation

## 2013-07-31 DIAGNOSIS — Z8742 Personal history of other diseases of the female genital tract: Secondary | ICD-10-CM | POA: Insufficient documentation

## 2013-07-31 DIAGNOSIS — Z9889 Other specified postprocedural states: Secondary | ICD-10-CM | POA: Insufficient documentation

## 2013-07-31 DIAGNOSIS — O039 Complete or unspecified spontaneous abortion without complication: Secondary | ICD-10-CM | POA: Insufficient documentation

## 2013-07-31 DIAGNOSIS — Z87440 Personal history of urinary (tract) infections: Secondary | ICD-10-CM | POA: Insufficient documentation

## 2013-07-31 DIAGNOSIS — R110 Nausea: Secondary | ICD-10-CM | POA: Insufficient documentation

## 2013-07-31 DIAGNOSIS — Z79899 Other long term (current) drug therapy: Secondary | ICD-10-CM | POA: Insufficient documentation

## 2013-07-31 LAB — CBC WITH DIFFERENTIAL/PLATELET
BASOS ABS: 0.1 10*3/uL (ref 0.0–0.1)
Basophils Relative: 1 % (ref 0–1)
Eosinophils Absolute: 0.1 10*3/uL (ref 0.0–0.7)
Eosinophils Relative: 2 % (ref 0–5)
HCT: 37.5 % (ref 36.0–46.0)
Hemoglobin: 12.5 g/dL (ref 12.0–15.0)
Lymphocytes Relative: 24 % (ref 12–46)
Lymphs Abs: 2.2 10*3/uL (ref 0.7–4.0)
MCH: 28.6 pg (ref 26.0–34.0)
MCHC: 33.3 g/dL (ref 30.0–36.0)
MCV: 85.8 fL (ref 78.0–100.0)
Monocytes Absolute: 0.6 10*3/uL (ref 0.1–1.0)
Monocytes Relative: 6 % (ref 3–12)
Neutro Abs: 6.2 10*3/uL (ref 1.7–7.7)
Neutrophils Relative %: 67 % (ref 43–77)
PLATELETS: 246 10*3/uL (ref 150–400)
RBC: 4.37 MIL/uL (ref 3.87–5.11)
RDW: 12.7 % (ref 11.5–15.5)
WBC: 9.2 10*3/uL (ref 4.0–10.5)

## 2013-07-31 LAB — BASIC METABOLIC PANEL
BUN: 9 mg/dL (ref 6–23)
CALCIUM: 9.8 mg/dL (ref 8.4–10.5)
CHLORIDE: 102 meq/L (ref 96–112)
CO2: 26 mEq/L (ref 19–32)
CREATININE: 0.69 mg/dL (ref 0.50–1.10)
GFR calc non Af Amer: >90 mL/min (ref >90)
Glucose, Bld: 99 mg/dL (ref 70–99)
Potassium: 4.3 mEq/L (ref 3.7–5.3)
SODIUM: 140 meq/L (ref 137–147)

## 2013-07-31 LAB — ABO/RH: ABO/RH(D): O POS

## 2013-07-31 MED ORDER — ONDANSETRON HCL 4 MG/2ML IJ SOLN
4.0000 mg | Freq: Once | INTRAMUSCULAR | Status: AC
  Administered 2013-07-31: 4 mg via INTRAMUSCULAR
  Filled 2013-07-31: qty 2

## 2013-07-31 MED ORDER — HYDROMORPHONE HCL PF 1 MG/ML IJ SOLN
0.5000 mg | Freq: Once | INTRAMUSCULAR | Status: AC
  Administered 2013-07-31: 0.5 mg via INTRAVENOUS
  Filled 2013-07-31: qty 1

## 2013-07-31 NOTE — ED Provider Notes (Signed)
CSN: [id number]     Arrival date & time 07/31/13  1133 History   First MD Initiated Contact with Patient 07/31/13 1144     Chief Complaint  Patient presents with  . Miscarriage     (Consider location/radiation/quality/duration/timing/severity/associated sxs/prior Treatment) Patient is a 33 y.o. female presenting with vaginal bleeding. The history is provided by the patient.  Vaginal Bleeding Quality:  Clots, bright red and lighter than menses Severity:  Moderate Onset quality:  Gradual Duration:  3 days Progression:  Worsening Chronicity:  New Number of pads used:  5 per day Possible pregnancy: yes   Context: spontaneously   Relieved by:  Nothing Worsened by:  Nothing tried Ineffective treatments:  None tried Associated symptoms: abdominal pain and nausea   Associated symptoms: no back pain, no dysuria and no fever    Flo ShanksSandy M Flinn is a 33 y.o. [id number] @ [redacted] weeks gestation by ultrasound done in the OB office 3 days ago. Dr. Despina HiddenEure told the patient that although she was 10 weeks by her LMP the pregnancy had stopped growing at 8 weeks and there was no heart beat. He gave her management options of expectant management or Cytotec and she is doing expectant management. She started bleeding 2 days. Yesterday had some cramping but it got better. This morning the cramping has been sever. The bleeding is like a light period but she has passed some clots.  Past Medical History  Diagnosis Date  . Abnormal Pap smear   . Ovarian cyst 2012    right  . Irregular menses 05/04/2012  . Hx of urinary tract infection   . Anxiety   . Pregnant 07/02/2013  . Bleeding in early pregnancy 07/02/2013   Past Surgical History  Procedure Laterality Date  . Cesarean section    . Dilation and curettage of uterus  2005  . Cryotherapy     Family History  Problem Relation Age of Onset  . Hypertension Mother   . Diabetes Paternal Grandmother   . Cancer Paternal Grandmother     breast cancer  . Diabetes  Paternal Grandfather   . Heart disease Father    History  Substance Use Topics  . Smoking status: Never Smoker   . Smokeless tobacco: Never Used  . Alcohol Use: No   OB History   Grav Para Term Preterm Abortions TAB SAB Ect Mult Living   7 2 2  5  5   2      Review of Systems  Constitutional: Negative for fever and chills.  HENT: Negative.   Eyes: Negative for visual disturbance.  Respiratory: Negative.   Cardiovascular: Negative for chest pain.  Gastrointestinal: Positive for nausea and abdominal pain. Negative for vomiting.  Genitourinary: Positive for vaginal bleeding. Negative for dysuria, urgency and frequency.  Musculoskeletal: Negative for back pain.  Skin: Negative for rash.  Neurological: Negative for syncope and light-headedness.  Psychiatric/Behavioral: Negative for confusion. The patient is not nervous/anxious.       Allergies  Celexa  Home Medications   Prior to Admission medications   Medication Sig Start Date End Date Taking? Authorizing Starletta Houchin  HYDROcodone-acetaminophen (NORCO) 10-325 MG per tablet Take 1 tablet by mouth every 6 (six) hours as needed. 07/30/13   Adline PotterJennifer A Griffin, NP  Prenatal Multivit-Min-Fe-FA (PRENATAL VITAMINS PO) Take 1 tablet by mouth daily.    Historical Lil Lepage, MD   BP 148/85  Pulse 80  Temp(Src) 98.3 F (36.8 C) (Oral)  Resp 18  Ht 5\' 11"  (1.803 m)  Wt 202 lb (91.627 kg)  BMI 28.19 kg/m2  SpO2 98%  LMP 05/01/2013 Physical Exam  Nursing note and vitals reviewed. Constitutional: She is oriented to person, place, and time. She appears well-developed and well-nourished. No distress.  HENT:  Head: Normocephalic.  Eyes: EOM are normal.  Neck: Neck supple.  Cardiovascular: Normal rate.   Pulmonary/Chest: Effort normal.  Abdominal: Soft. There is tenderness.  Tender lower abdomen that is mild.   Genitourinary:  External genitalia without lesions, small blood vaginal vault. Cervix closed, uterus slightly enlarged.    Musculoskeletal: Normal range of motion.  Neurological: She is alert and oriented to person, place, and time. No cranial nerve deficit.  Skin: Skin is warm and dry.  Psychiatric: She has a normal mood and affect. Her behavior is normal.    ED Course  Procedures (including critical care time) Labs Review Labs Reviewed  CBC WITH DIFFERENTIAL  BASIC METABOLIC PANEL  ABO/RH   Results for orders placed during the hospital encounter of 07/31/13 (from the past 24 hour(s))  CBC WITH DIFFERENTIAL     Status: None   Collection Time    07/31/13 11:50 AM      Result Value Ref Range   WBC 9.2  4.0 - 10.5 K/uL   RBC 4.37  3.87 - 5.11 MIL/uL   Hemoglobin 12.5  12.0 - 15.0 g/dL   HCT 16.1  09.6 - 04.5 %   MCV 85.8  78.0 - 100.0 fL   MCH 28.6  26.0 - 34.0 pg   MCHC 33.3  30.0 - 36.0 g/dL   RDW 40.9  81.1 - 91.4 %   Platelets 246  150 - 400 K/uL   Neutrophils Relative % 67  43 - 77 %   Neutro Abs 6.2  1.7 - 7.7 K/uL   Lymphocytes Relative 24  12 - 46 %   Lymphs Abs 2.2  0.7 - 4.0 K/uL   Monocytes Relative 6  3 - 12 %   Monocytes Absolute 0.6  0.1 - 1.0 K/uL   Eosinophils Relative 2  0 - 5 %   Eosinophils Absolute 0.1  0.0 - 0.7 K/uL   Basophils Relative 1  0 - 1 %   Basophils Absolute 0.1  0.0 - 0.1 K/uL  ABO/RH     Status: None   Collection Time    07/31/13 11:50 AM      Result Value Ref Range   ABO/RH(D) O POS    BASIC METABOLIC PANEL     Status: None   Collection Time    07/31/13 11:50 AM      Result Value Ref Range   Sodium 140  137 - 147 mEq/L   Potassium 4.3  3.7 - 5.3 mEq/L   Chloride 102  96 - 112 mEq/L   CO2 26  19 - 32 mEq/L   Glucose, Bld 99  70 - 99 mg/dL   BUN 9  6 - 23 mg/dL   Creatinine, Ser 7.82  0.50 - 1.10 mg/dL   Calcium 9.8  8.4 - 95.6 mg/dL   GFR calc non Af Amer >90  >90 mL/min   GFR calc Af Amer >90  >90 mL/min   US Ob Comp Less 14 Wks  07/31/2013   CLINICAL DATA:  Vaginal bleeding ? SAB, severe pain  EXAM: OBSTETRIC <14 WK ULTRASOUND  TECHNIQUE:  Transabdominal ultrasound was performed for evaluation of the gestation as well as the maternal uterus and adnexal regions.  COMPARISON:  Pelvic ultrasound  dated 07/28/2013  FINDINGS: Intrauterine gestational sac: Not visualized  Yolk sac:  Not visualized  Embryo:  Not visualized  Cardiac Activity: Not appreciated  Heart Rate: Not applicable  Maternal uterus/adnexae: Diffuse increased echogenicity within the endometrial canal with a more complex appearance the lower uterine segment and cervical region. The comparison imaging demonstrates an intrauterine gestational sac.  IMPRESSION: Findings consistent with a spontaneous abortion.   Electronically Signed   By: Salome HolmesHector  Cooper M.D.   On: 07/31/2013 13:55     MDM  Discussed clinical, lab and ultrasound findings with Dr. Jolayne Pantheronstant on call for Dr. Despina HiddenEure. Patient stable for discharge without any pain and minimal bleeding at this time. Discussed that she may have additional bleeding and pain. She has percocet 10/325 mg at home for pain. Discussed if she has heavy bleeding, more than a pad an hour, fever, persistent vomiting or other [facility]. She voices understanding and agrees to plan.     MontezumaHope M Neese, [place]NP 07/31/13 2035

## 2013-07-31 NOTE — Discharge Instructions (Signed)
Follow up with Dr. Despina HiddenEure in the next few days. Return here sooner for heavy bleeding, (saturating more than a pad an hour) fever, severe pain or other problems.   Miscarriage A miscarriage is the loss of an unborn baby (fetus) before the 20th week of pregnancy. The cause is often unknown.  HOME CARE  You may need to stay in bed (bed rest), or you may be able to do light activity. Go about activity as told by your doctor.  Have help at home.  Write down how many pads you use each day. Write down how soaked they are.  Do not use tampons. Do not wash out your vagina (douche) or have sex (intercourse) until your doctor approves.  Only take medicine as told by your doctor.  Do not take aspirin.  Keep all doctor visits as told.  If you or your partner have problems with grieving, talk to your doctor. You can also try counseling. Give yourself time to grieve before trying to get pregnant again. GET HELP RIGHT AWAY IF:  You have bad cramps or pain in your back or belly (abdomen).  You have a fever.  You pass large clumps of blood (clots) from your vagina that are walnut-sized or larger. Save the clumps for your doctor to see.  You pass large amounts of tissue from your vagina. Save the tissue for your doctor to see.  You have more bleeding.  You have thick, bad-smelling fluid (discharge) coming from the vagina.  You get lightheaded, weak, or you pass out (faint).  You have chills. MAKE SURE YOU:  Understand these instructions.  Will watch your condition.  Will get help right away if you are not doing well or get worse. Document Released: 04/22/2011 Document Reviewed: 04/22/2011 [facility] Patient Information 2015 [place], [place]. This information is not intended to replace advice given to you by your health care provider. Make sure you discuss any questions you have with your health care provider.

## 2013-07-31 NOTE — ED Notes (Signed)
H. Neese, NP at bedside 

## 2013-07-31 NOTE — ED Notes (Addendum)
This is pt's 5th pregnancy per family member, hx of 2 miscarriages in past

## 2013-07-31 NOTE — ED Notes (Signed)
Pt was told Thursday by OB that baby had no heart beat. Pt started bleeding Wednesday of this week. Pt states has been in pain but lasdt 2-3 hrs has been much worse. Pt crying and fetal position due to pain.

## 2013-08-01 NOTE — ED Provider Notes (Signed)
Medical screening examination/treatment/procedure(s) were conducted as a shared visit with non-physician practitioner(s) and myself.  I personally evaluated the patient during the encounter.   EKG Interpretation None     Patient is hemodynamically stable. Ultrasound confirmed spontaneous abortion.  Discussed with obstetrician. No acute intervention necessary at this time  Donnetta HutchingBrian Cook, MD 08/01/13 [phone]

## 2013-08-02 ENCOUNTER — Telehealth: Payer: Self-pay | Admitting: Adult Health

## 2013-08-02 NOTE — Telephone Encounter (Signed)
[name] called me back she feels much better went to ER 6/20 and with pain US showed she passed tissue  Has F/U appt 7/2

## 2013-08-02 NOTE — Telephone Encounter (Signed)
No answer,

## 2013-08-04 ENCOUNTER — Other Ambulatory Visit: Payer: Self-pay | Admitting: Adult Health

## 2013-08-04 ENCOUNTER — Telehealth: Payer: Self-pay | Admitting: Adult Health

## 2013-08-04 DIAGNOSIS — O021 Missed abortion: Secondary | ICD-10-CM

## 2013-08-04 NOTE — Telephone Encounter (Signed)
Pt is sp miscarriage but still passing clots will come in tomorrow at 2:45 for US and see me

## 2013-08-05 ENCOUNTER — Ambulatory Visit (INDEPENDENT_AMBULATORY_CARE_PROVIDER_SITE_OTHER): Payer: PRIVATE HEALTH INSURANCE | Admitting: Adult Health

## 2013-08-05 ENCOUNTER — Encounter: Payer: Self-pay | Admitting: Adult Health

## 2013-08-05 ENCOUNTER — Ambulatory Visit (INDEPENDENT_AMBULATORY_CARE_PROVIDER_SITE_OTHER): Payer: PRIVATE HEALTH INSURANCE

## 2013-08-05 VITALS — BP 104/68 | Ht 71.0 in | Wt 213.0 lb

## 2013-08-05 DIAGNOSIS — O039 Complete or unspecified spontaneous abortion without complication: Secondary | ICD-10-CM

## 2013-08-05 DIAGNOSIS — O021 Missed abortion: Secondary | ICD-10-CM

## 2013-08-05 NOTE — Patient Instructions (Signed)
Return as scheduled 

## 2013-08-05 NOTE — Progress Notes (Signed)
Subjective:     Patient ID: Flo ShanksSandy M Negron, female   DOB: 1981-01-16, 33 y.o.   MRN: [id number]  HPI [name] is a 33 year old white female in for US, sp miscarriage and is still bleeding heavy with clots.  Review of Systems See HPI Reviewed past medical,surgical, social and family history. Reviewed medications and allergies.     Objective:   Physical Exam BP 104/68  Ht 5\' 11"  (1.803 m)  Wt 213 lb (96.616 kg)  BMI 29.72 kg/m2  LMP 05/01/2013  Breastfeeding? No Uterus Anteverted uterus  Endometrium 15 mm fundal region of cavity and 16mm endometrial thickness noted in lower uterine segment, endometrium with increased echogenecity noted  Right ovary Appears WNL  Left ovary Appears WNL  No free fluid or adnexal masses noted within the pelvis  Technician Comments:  Anteverted uterus with thickened endometrium noted, NO GS noted on today's exam although endometrial cavity =5216mm in LUS, bilateral ovaries appear WNL, no free fluid or adnexal masses noted  US reviewed with pt, she declines cytotec, reassured.      Assessment:     Miscarriage     Plan:     [facility], 08/12/13 No sex

## 2013-08-12 ENCOUNTER — Other Ambulatory Visit: Payer: PRIVATE HEALTH INSURANCE

## 2013-08-12 ENCOUNTER — Ambulatory Visit: Payer: PRIVATE HEALTH INSURANCE | Admitting: Adult Health

## 2013-08-12 DIAGNOSIS — O021 Missed abortion: Secondary | ICD-10-CM

## 2013-08-12 DIAGNOSIS — O200 Threatened abortion: Secondary | ICD-10-CM

## 2013-08-12 NOTE — Addendum Note (Signed)
Addended by: Colen DarlingYOUNG, Juri Dinning S on: 08/12/2013 04:10 PM   Modules accepted: Orders

## 2013-08-13 LAB — HCG, QUANTITATIVE, PREGNANCY: hCG, Beta Chain, Quant, S: 1153.5 m[IU]/mL

## 2013-08-16 ENCOUNTER — Telehealth: Payer: Self-pay | Admitting: Adult Health

## 2013-08-16 NOTE — Telephone Encounter (Signed)
Call Could not be completed

## 2013-08-17 ENCOUNTER — Telehealth: Payer: Self-pay | Admitting: Adult Health

## 2013-08-17 NOTE — Telephone Encounter (Signed)
Pt aware of labs recheck 7/13 at 4 pm

## 2013-08-23 ENCOUNTER — Telehealth: Payer: Self-pay | Admitting: Adult Health

## 2013-08-23 ENCOUNTER — Other Ambulatory Visit: Payer: PRIVATE HEALTH INSURANCE

## 2013-08-23 NOTE — Telephone Encounter (Signed)
[place] had heavy bleeding Saturday [place] no fragments seen +blood, HGB 11 QHCG 300, bleeding lighter, will [facility] 7[facility]20 [facility]

## 2013-08-30 ENCOUNTER — Other Ambulatory Visit: Payer: Self-pay | Admitting: Adult Health

## 2013-08-30 ENCOUNTER — Other Ambulatory Visit (INDEPENDENT_AMBULATORY_CARE_PROVIDER_SITE_OTHER): Payer: PRIVATE HEALTH INSURANCE

## 2013-08-30 ENCOUNTER — Other Ambulatory Visit: Payer: PRIVATE HEALTH INSURANCE

## 2013-08-30 ENCOUNTER — Ambulatory Visit (INDEPENDENT_AMBULATORY_CARE_PROVIDER_SITE_OTHER): Payer: PRIVATE HEALTH INSURANCE | Admitting: Adult Health

## 2013-08-30 ENCOUNTER — Telehealth: Payer: Self-pay | Admitting: Adult Health

## 2013-08-30 ENCOUNTER — Encounter: Payer: Self-pay | Admitting: Adult Health

## 2013-08-30 VITALS — BP 100/72 | Ht 71.0 in | Wt 209.5 lb

## 2013-08-30 DIAGNOSIS — N939 Abnormal uterine and vaginal bleeding, unspecified: Secondary | ICD-10-CM

## 2013-08-30 DIAGNOSIS — N926 Irregular menstruation, unspecified: Secondary | ICD-10-CM

## 2013-08-30 MED ORDER — MISOPROSTOL 200 MCG PO TABS: ORAL_TABLET | ORAL | Status: DC

## 2013-08-30 NOTE — Progress Notes (Signed)
Subjective:     Patient ID: Kristin Buckley, female   DOB: 02/01/1981, 33 y.o.   MRN: [id number]  HPI Kristin Buckley is a 33 year old white female, work in visit, complaining of passing clots and heavy bleeding this am at work [employer]essed clothes up.She is sp miscarriage.Some cramps but not bad.  Review of Systems See HPI Reviewed past medical,surgical, social and family history. Reviewed medications and allergies.     Objective:   Physical Exam BP 100/72  Ht 5\' 11"  (1.803 m)  Wt 209 lb 8 oz (95.029 kg)  BMI 29.23 kg/m2  LMP 08/30/2013   Reviewed US with pt Uterus 10.4 x 6.2 x 5.0 cm, no myometrial masses noted  Endometrium 17.4 mm,thickened endometrium noted within the fundus with internal debris noted  Right ovary 3.6 x 2.6 x 2.3 cm,  Left ovary 2.8 x 1.8 x 1.6 cm,  No free fluid or adnexal masses noted within the pelvis  Technician Comments:  Anteverted uterus, Endom-17.494mm thickened with debris noted within, bilateral ovaries/adnexa appears WNL no free fluid or adnexal masses noted within the pelvis  Discussed with Dr Loraine LericheEure,he recommends cytotec 800 mcg now and in 24 hours, she has pain meds at home.  Assessment:     AUB    Plan:    Rx cytotec 200 mcg #8 take 4 now and 4 in am Return in 2 weeks [facility] now Note to return to work 09/01/13.

## 2013-08-30 NOTE — Telephone Encounter (Signed)
Talked with Dr Despina HiddenEure, take Cytotec, pt aware

## 2013-08-30 NOTE — Telephone Encounter (Signed)
Pt says she really wants D&C not taking the cytotec, will discuss with Dr Despina HiddenEure and pt to call after 3pm

## 2013-08-30 NOTE — Patient Instructions (Signed)
Take cytotec today and in am Rest push fluids Return in  2 weeks for [facility] Dysfunctional Uterine Bleeding Normally, menstrual periods begin between ages 8 to 48 in young women. A normal menstrual cycle/period may begin every 23 days up to 35 days and lasts from 1 to 7 days. Around 12 to 14 days before your menstrual period starts, ovulation (ovary produces an egg) occurs. When counting the time between menstrual periods, count from the first day of bleeding of the previous period to the first day of bleeding of the next period. Dysfunctional (abnormal) uterine bleeding is bleeding that is different from a normal menstrual period. Your periods may come earlier or later than usual. They may be lighter, have blood clots or be heavier. You may have bleeding between periods, or you may skip one period or more. You may have bleeding after sexual intercourse, bleeding after menopause, or no menstrual period. CAUSES   Pregnancy (normal, miscarriage, tubal).  IUDs (intrauterine device, birth control).  Birth control pills.  Hormone treatment.  Menopause.  Infection of the cervix.  Blood clotting problems.  Infection of the inside lining of the uterus.  Endometriosis, inside lining of the uterus growing in the pelvis and other female organs.  Adhesions (scar tissue) inside the uterus.  Obesity or severe weight loss.  Uterine polyps inside the uterus.  Cancer of the vagina, cervix, or uterus.  Ovarian cysts or polycystic ovary syndrome.  Medical problems (diabetes, thyroid disease).  Uterine fibroids (noncancerous tumor).  Problems with your female hormones.  Endometrial hyperplasia, very thick lining and enlarged cells inside of the uterus.  Medicines that interfere with ovulation.  Radiation to the pelvis or abdomen.  Chemotherapy. DIAGNOSIS   Your doctor will discuss the history of your menstrual periods, medicines you are taking, changes in your weight, stress in your  life, and any medical problems you may have.  Your doctor will do a physical and pelvic examination.  Your doctor may want to perform certain tests to make a diagnosis, such as:  Pap test.  Blood tests.  Cultures for infection.  CT scan.  Ultrasound.  Hysteroscopy.  Laparoscopy.  MRI.  Hysterosalpingography.  D and C.  Endometrial biopsy. TREATMENT  Treatment will depend on the cause of the dysfunctional uterine bleeding (DUB). Treatment may include:  Observing your menstrual periods for a couple of months.  Prescribing medicines for medical problems, including:  Antibiotics.  Hormones.  Birth control pills.  Removing an IUD (intrauterine device, birth control).  Surgery:  D and C (scrape and remove tissue from inside the uterus).  Laparoscopy (examine inside the abdomen with a lighted tube).  Uterine ablation (destroy lining of the uterus with electrical current, laser, heat, or freezing).  Hysteroscopy (examine cervix and uterus with a lighted tube).  Hysterectomy (remove the uterus). HOME CARE INSTRUCTIONS   If medicines were prescribed, take exactly as directed. Do not change or switch medicines without consulting your caregiver.  Long term heavy bleeding may result in iron deficiency. Your caregiver may have prescribed iron pills. They help replace the iron that your body lost from heavy bleeding. Take exactly as directed.  Do not take aspirin or medicines that contain aspirin one week before or during your menstrual period. Aspirin may make the bleeding worse.  If you need to change your sanitary pad or tampon more than once every 2 hours, stay in bed with your feet elevated and a cold pack on your lower abdomen. Rest as much as possible, until  the bleeding stops or slows down.  Eat well-balanced meals. Eat foods high in iron. Examples are:  Leafy green vegetables.  Whole-grain breads and cereals.  Eggs.  Meat.  Liver.  Do not try to  lose weight until the abnormal bleeding has stopped and your blood iron level is back to normal. Do not lift more than ten pounds or do strenuous activities when you are bleeding.  For a couple of months, make note on your calendar, marking the start and ending of your period, and the type of bleeding (light, medium, heavy, spotting, clots or missed periods). This is for your caregiver to better evaluate your problem. SEEK MEDICAL CARE IF:   You develop nausea (feeling sick to your stomach) and vomiting, dizziness, or diarrhea while you are taking your medicine.  You are getting lightheaded or weak.  You have any problems that may be related to the medicine you are taking.  You develop pain with your DUB.  You want to remove your IUD.  You want to stop or change your birth control pills or hormones.  You have any type of abnormal bleeding mentioned above.  You are over 33 years old and have not had a menstrual period yet.  You are 33 years old and you are still having menstrual periods.  You have any of the symptoms mentioned above.  You develop a rash. SEEK IMMEDIATE MEDICAL CARE IF:   An oral temperature above 102 F (38.9 C) develops.  You develop chills.  You are changing your sanitary pad or tampon more than once an hour.  You develop abdominal pain.  You pass out or faint. Document Released: 01/26/2000 Document Revised: 04/22/2011 Document Reviewed: 12/27/2008 [facility] Patient Information 2015 [place]ExitCare, [place]LL[place]. This information is not intended to replace advice given to you by your health care provider. Make sure you discuss any questions you have with your health care provider.

## 2013-08-31 ENCOUNTER — Telehealth: Payer: Self-pay | Admitting: Adult Health

## 2013-08-31 LAB — HCG, QUANTITATIVE, PREGNANCY: hCG, Beta Chain, Quant, S: 118.5 m[IU]/mL

## 2013-08-31 NOTE — Telephone Encounter (Signed)
No answer

## 2013-08-31 NOTE — Telephone Encounter (Signed)
Pt aware QHCG 118.5 has taken both doses of Cytotec no cramps or bleeding yet

## 2013-09-13 ENCOUNTER — Other Ambulatory Visit: Payer: PRIVATE HEALTH INSURANCE

## 2013-11-10 ENCOUNTER — Encounter: Payer: Self-pay | Admitting: Obstetrics & Gynecology

## 2013-11-10 ENCOUNTER — Ambulatory Visit (INDEPENDENT_AMBULATORY_CARE_PROVIDER_SITE_OTHER): Payer: PRIVATE HEALTH INSURANCE | Admitting: Obstetrics & Gynecology

## 2013-11-10 VITALS — BP 132/90 | Ht 71.0 in | Wt 213.5 lb

## 2013-11-10 DIAGNOSIS — N939 Abnormal uterine and vaginal bleeding, unspecified: Secondary | ICD-10-CM

## 2013-11-10 DIAGNOSIS — N926 Irregular menstruation, unspecified: Secondary | ICD-10-CM

## 2013-11-10 MED ORDER — MEGESTROL ACETATE 40 MG PO TABS: ORAL_TABLET | ORAL | Status: DC

## 2013-11-10 NOTE — Progress Notes (Signed)
Patient ID: Kristin ShanksSandy M Buckley, female   DOB: 1980-03-12, 33 y.o.   MRN: [id number] Chief Complaint  Patient presents with  . abnormal vaginal bleeding since MAB in June    HPI Kristin CampanileSandy is in for evaluation of abnormal uterine bleeding. She unfortunately experienced a miscarriage back in June and had on again off again heavy light and between bleeding early until second week of August and she had a regular period in September 7 through the 11 seemingly normal  However since then she's been spotting bleeding heavy variable cramping and pressure  ROS No burning with urination, frequency or urgency No nausea, vomiting or diarrhea Nor fever chills or other constitutional symptoms   Blood pressure 132/90, height 5\' 11"  (1.803 m), weight 213 lb 8 oz (96.843 kg), last menstrual period 10/18/2013.  EXAM Abdomen:      soft Vulva:            normal appearing vulva with no masses, tenderness or lesions Vagina:          normal mucosa, no discharge Cervix:           normal appearance Uterus:          uterus is normal size, shape, consistency and nontender Adnexa:         normal adnexa in size, nontender and no masses Rectal:            Hemoccult:                               Assessment/Plan:  Abnormal uterine bleeding, probably endometrial large origin  Will use megestrol to mature endometrium and then withdraw it followup in in 6 weeks and is still having difficulty we will do an ultrasound

## 2013-12-13 ENCOUNTER — Encounter: Payer: Self-pay | Admitting: Obstetrics & Gynecology

## 2013-12-22 ENCOUNTER — Ambulatory Visit: Payer: PRIVATE HEALTH INSURANCE | Admitting: Obstetrics & Gynecology

## 2014-07-06 ENCOUNTER — Encounter: Payer: Self-pay | Admitting: Family Medicine

## 2014-07-06 ENCOUNTER — Ambulatory Visit (INDEPENDENT_AMBULATORY_CARE_PROVIDER_SITE_OTHER): Payer: PRIVATE HEALTH INSURANCE | Admitting: Family Medicine

## 2014-07-06 VITALS — Temp 97.5°F | Ht 71.0 in | Wt 213.0 lb

## 2014-07-06 DIAGNOSIS — J029 Acute pharyngitis, unspecified: Secondary | ICD-10-CM

## 2014-07-06 DIAGNOSIS — J020 Streptococcal pharyngitis: Secondary | ICD-10-CM | POA: Diagnosis not present

## 2014-07-06 LAB — POCT RAPID STREP A (OFFICE): Rapid Strep A Screen: POSITIVE — AB

## 2014-07-06 MED ORDER — CEFTRIAXONE SODIUM 1 G IJ SOLR
1.0000 g | Freq: Once | INTRAMUSCULAR | Status: AC
  Administered 2014-07-06: 1 g via INTRAMUSCULAR

## 2014-07-06 MED ORDER — AZITHROMYCIN 250 MG PO TABS: ORAL_TABLET | ORAL | Status: DC

## 2014-07-06 NOTE — Progress Notes (Signed)
   Subjective:    Patient ID: Kristin Buckley, female    DOB: 04/07/1980, 34 y.o.   MRN: [id number]  Sore Throat  This is a new problem. The current episode started in the past 7 days. Associated symptoms include ear pain and headaches. Associated symptoms comments: fever. She has tried acetaminophen and NSAIDs for the symptoms.   PMH benign   Review of Systems  HENT: Positive for ear pain.   Neurological: Positive for headaches.   Denies wheezing vomiting diarrhea relates sore throat low-grade fever doesn't feel good body aches    Objective:   Physical Exam Exudate noted anterior lymphadenopathy lungs clear heart regular positive strep test       Assessment & Plan:  Pharyngitis secondary infection recommend shot of anabolic's along with antibodies prescribed if any problems follow-up warning signs were discussed in detail. Strep pharyngitis

## 2014-08-17 ENCOUNTER — Ambulatory Visit: Payer: PRIVATE HEALTH INSURANCE | Admitting: Family Medicine

## 2014-08-18 ENCOUNTER — Ambulatory Visit: Payer: PRIVATE HEALTH INSURANCE | Admitting: Nurse Practitioner

## 2014-10-12 ENCOUNTER — Other Ambulatory Visit: Payer: Self-pay | Admitting: Family Medicine

## 2014-10-12 NOTE — Telephone Encounter (Signed)
Seen only once by dr Lorin Picket in last yr for sinus find out who rxed for phentermine last mo call pharmacy if [facility]

## 2014-10-12 NOTE — Telephone Encounter (Signed)
[facility] and was informed that patient last had Adipex 37.5 MG filled on 05/19/2013 by Dr.Scott Luking.

## 2014-10-12 NOTE — Telephone Encounter (Signed)
Discussed with patient. Patient transferred up front to schedule an office visit to discuss

## 2014-10-12 NOTE — Telephone Encounter (Signed)
Sorry will need o v with dr Lorin Picket before considering reinitaition

## 2014-10-12 NOTE — Telephone Encounter (Signed)
phentermine (ADIPEX-P) 37.5 MG tablet  Pt states she got this filled like a month ago, did not see record of that? Please refill if you can without being seen and send to   [facility]

## 2014-10-13 ENCOUNTER — Ambulatory Visit (INDEPENDENT_AMBULATORY_CARE_PROVIDER_SITE_OTHER): Payer: PRIVATE HEALTH INSURANCE | Admitting: Family Medicine

## 2014-10-13 ENCOUNTER — Ambulatory Visit: Payer: PRIVATE HEALTH INSURANCE | Admitting: Nurse Practitioner

## 2014-10-13 ENCOUNTER — Encounter: Payer: Self-pay | Admitting: Family Medicine

## 2014-10-13 VITALS — BP 118/76 | Temp 98.7°F | Ht 71.0 in | Wt 212.0 lb

## 2014-10-13 DIAGNOSIS — J029 Acute pharyngitis, unspecified: Secondary | ICD-10-CM

## 2014-10-13 DIAGNOSIS — J020 Streptococcal pharyngitis: Secondary | ICD-10-CM | POA: Diagnosis not present

## 2014-10-13 LAB — POCT RAPID STREP A (OFFICE): RAPID STREP A SCREEN: POSITIVE — AB

## 2014-10-13 MED ORDER — AZITHROMYCIN 250 MG PO TABS: ORAL_TABLET | ORAL | Status: DC

## 2014-10-13 NOTE — Progress Notes (Signed)
   Subjective:    Patient ID: Kristin Buckley, female    DOB: [birth year], 34 y.o.   MRN: [id number]  Sore Throat  This is a new problem. The current episode started yesterday. Associated symptoms include coughing. She has tried acetaminophen and NSAIDs for the symptoms.    Scratchy throat  White spotds in throat  Lymph nodes swollen and tender  achey Low gr 99.5    Review of Systems  Respiratory: Positive for cough.    No vomiting no diarrhea slight cough    Objective:   Physical Exam  Alert afebrile vital signs stable. Lungs clear. Heart regular in rhythm. H&T pharynx erythematous white exudate positive tender anterior nodes      Assessment & Plan:  Impression strep throat discussed plan Z-Pak. Multiple questions answered. 2 early to recommend ENT referral for tonsillectomy [facility]

## 2014-10-20 ENCOUNTER — Encounter: Payer: Self-pay | Admitting: Nurse Practitioner

## 2014-10-20 ENCOUNTER — Ambulatory Visit (INDEPENDENT_AMBULATORY_CARE_PROVIDER_SITE_OTHER): Payer: PRIVATE HEALTH INSURANCE | Admitting: Nurse Practitioner

## 2014-10-20 VITALS — BP 130/80 | Temp 98.9°F | Ht 71.0 in | Wt 213.0 lb

## 2014-10-20 DIAGNOSIS — R5383 Other fatigue: Secondary | ICD-10-CM | POA: Diagnosis not present

## 2014-10-20 DIAGNOSIS — E669 Obesity, unspecified: Secondary | ICD-10-CM

## 2014-10-20 MED ORDER — PHENTERMINE HCL 37.5 MG PO TABS: 37.5000 mg | ORAL_TABLET | Freq: Every day | ORAL | Status: DC

## 2014-10-20 NOTE — Progress Notes (Signed)
Subjective:  Presents to discuss her weight. Has had difficulty losing weight. Currently in nursing school. Limited exercise due to her schedule. Also fatigue. Has taken phentermine without difficulty in the past. Has also had 3 episodes of strep over the past few months which is unusual for her. Just completed a Z-Pak, symptoms have resolved.  Objective:   BP 130/80 mmHg  Temp(Src) 98.9 F (37.2 C) (Oral)  Ht  (1.803 m)  Wt 213 lb (96.616 kg)  BMI 29.72 kg/m2 NAD. Alert, oriented. Lungs clear. Heart regular rate rhythm. Thyroid no masses or goiter noted, nontender to palpation.  Assessment:  Problem List Items Addressed This Visit      Other   Obesity   Relevant Medications   phentermine (ADIPEX-P) 37.5 MG tablet   Other Relevant Orders   CBC with Differential/Platelet   TSH    Other Visit Diagnoses    Other fatigue    -  Primary    Relevant Orders    CBC with Differential/Platelet    TSH      Plan:  Meds ordered this encounter  Medications  . phentermine (ADIPEX-P) 37.5 MG tablet    Sig: Take 1 tablet (37.5 mg total) by mouth daily before breakfast.    Dispense:  30 tablet    Refill:  2    Order Specific Question:  Supervising Provider    Answer:  Merlyn Albert [[id number]]   Restart phentermine as directed. Cautioned about potential adverse effects. DC med and call if any problems. Discussed importance of frequent handwashing and infection prevention. Patient was symptomatic with each case of strep, however if tests continues to be positive consider blood testing for strep carrier. Recheck in 3 months if she wishes to continue weight loss therapy.

## 2014-10-21 ENCOUNTER — Encounter: Payer: Self-pay | Admitting: Nurse Practitioner

## 2014-10-21 LAB — TSH: TSH: 1.97 u[IU]/mL (ref 0.450–4.500)

## 2014-10-21 LAB — CBC WITH DIFFERENTIAL/PLATELET
Basophils Absolute: 0 10*3/uL (ref 0.0–0.2)
Basos: 0 %
EOS (ABSOLUTE): 0.1 10*3/uL (ref 0.0–0.4)
Eos: 2 %
HEMATOCRIT: 40.7 % (ref 34.0–46.6)
HEMOGLOBIN: 13.4 g/dL (ref 11.1–15.9)
IMMATURE GRANULOCYTES: 0 %
Immature Grans (Abs): 0 10*3/uL (ref 0.0–0.1)
LYMPHS: 33 %
Lymphocytes Absolute: 3.1 10*3/uL (ref 0.7–3.1)
MCH: 28 pg (ref 26.6–33.0)
MCHC: 32.9 g/dL (ref 31.5–35.7)
MCV: 85 fL (ref 79–97)
MONOCYTES: 7 %
Monocytes Absolute: 0.7 10*3/uL (ref 0.1–0.9)
NEUTROS PCT: 58 %
Neutrophils Absolute: 5.3 10*3/uL (ref 1.4–7.0)
Platelets: 273 10*3/uL (ref 150–379)
RBC: 4.79 x10E6/uL (ref 3.77–5.28)
RDW: 13.4 % (ref 12.3–15.4)
WBC: 9.2 10*3/uL (ref 3.4–10.8)

## 2014-12-01 ENCOUNTER — Other Ambulatory Visit: Payer: Self-pay | Admitting: Nurse Practitioner

## 2015-07-13 ENCOUNTER — Encounter: Payer: PRIVATE HEALTH INSURANCE | Admitting: Family Medicine

## 2015-08-03 ENCOUNTER — Encounter: Payer: Self-pay | Admitting: Family Medicine

## 2015-08-03 ENCOUNTER — Ambulatory Visit (INDEPENDENT_AMBULATORY_CARE_PROVIDER_SITE_OTHER): Payer: PRIVATE HEALTH INSURANCE | Admitting: Family Medicine

## 2015-08-03 VITALS — BP 112/66 | HR 70 | Temp 98.2°F | Ht 68.5 in | Wt 220.0 lb

## 2015-08-03 DIAGNOSIS — Z0000 Encounter for general adult medical examination without abnormal findings: Secondary | ICD-10-CM

## 2015-08-03 DIAGNOSIS — J301 Allergic rhinitis due to pollen: Secondary | ICD-10-CM | POA: Diagnosis not present

## 2015-08-03 DIAGNOSIS — Z139 Encounter for screening, unspecified: Secondary | ICD-10-CM | POA: Diagnosis not present

## 2015-08-03 NOTE — Progress Notes (Signed)
   Subjective:    Patient ID: Kristin ShanksSandy M Hackman, female    DOB: 11-30-1980, 35 y.o.   MRN: [id number]  HPI The patient comes in today for a wellness visit.    A review of their health history was completed.  A review of medications was also completed.  Any needed refills; none  Eating habits: health conscious  Falls/  MVA accidents in past few months: none  Regular exercise: walking group, zumba, softball  Specialist pt sees on regular basis: none  Preventative health issues were discussed.   Additional concerns: none  Relates mild allergy symptoms over the past month some runny nose some cough some sneezing Patient does try to eat healthy try stay physically active. Safety dietary measures discussed. Review of Systems    patient denies chest tightness pressure pain shortness breath nausea vomiting diarrhea Objective:   Physical Exam  HEENT benign TMs NL T-NL neck no masses lungs clear no crackles heart regular extremities no edema      Assessment & Plan:  Moderate allergies-use Claritin this is safe with breast-feeding. Flonase if necessary. Follow-up if problems  Wellness cholesterol glucose recommended  Patient's physical was filled out for her school form. She does not have any idea where her childhood shot record is. She does state that she has several titers that show she is immune to measles as well as chickenpox and hepatitis B. Her forms were filled out for her.

## 2015-08-09 ENCOUNTER — Telehealth: Payer: Self-pay | Admitting: Family Medicine

## 2015-08-09 DIAGNOSIS — Z139 Encounter for screening, unspecified: Secondary | ICD-10-CM

## 2015-08-09 NOTE — Telephone Encounter (Signed)
Pt is needing a mumps titer sent over to [facility].

## 2015-08-09 NOTE — Telephone Encounter (Signed)
Spoke with patient and informed her per Dr.Scott Luking we are sending in orders for Mumps titer. Patient verbalized understanding and stated that she does not need any other titers.

## 2015-08-09 NOTE — Telephone Encounter (Signed)
Please order mumps titer. Confirm with the patient that she doesn't need any other titers please

## 2015-08-10 LAB — MUMPS ANTIBODY, IGG: MUMPS ABS, IGG: 162 AU/mL (ref >10.9)

## 2015-09-03 IMAGING — US US OB COMP LESS 14 WK
1 series · 14 of 28 positions shown · non-contrast
Comparison: Pelvic ultrasound dated 07/28/2013

CLINICAL DATA: Vaginal bleeding ? SAB, severe pain

EXAM:
OBSTETRIC <14 WK ULTRASOUND
TECHNIQUE: Transabdominal ultrasound was performed for evaluation of the
gestation as well as the maternal uterus and adnexal regions.

[Series 1: us ob comp less 14 wk · 0.18mm/px · 14 of 72 slices shown]
[im 3/72]
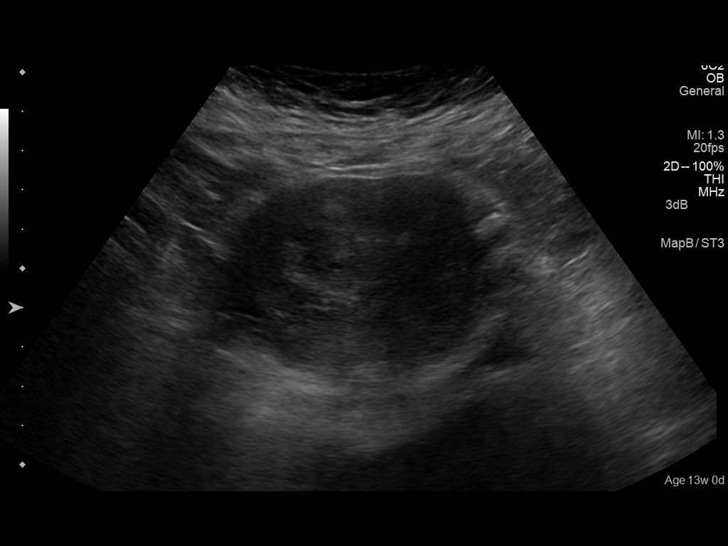
[im 8/72]
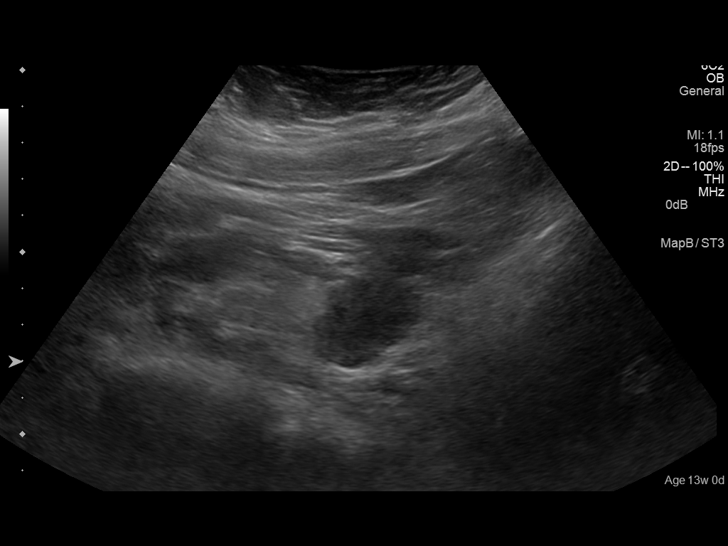
[im 14/72]
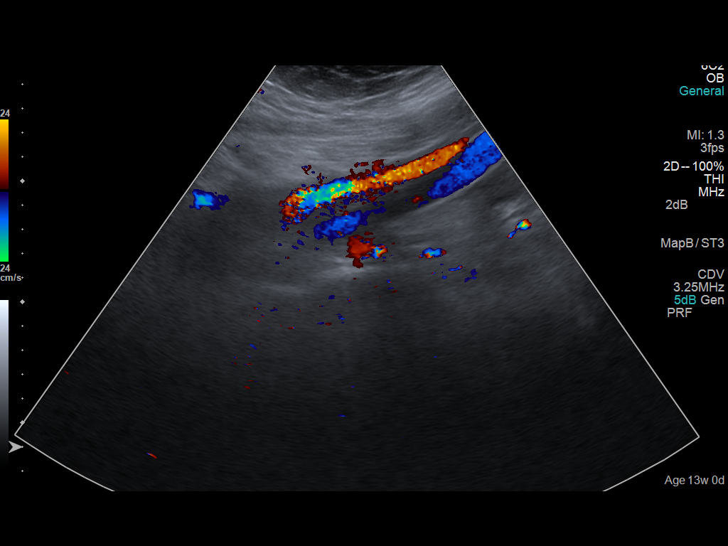
[im 19/72]
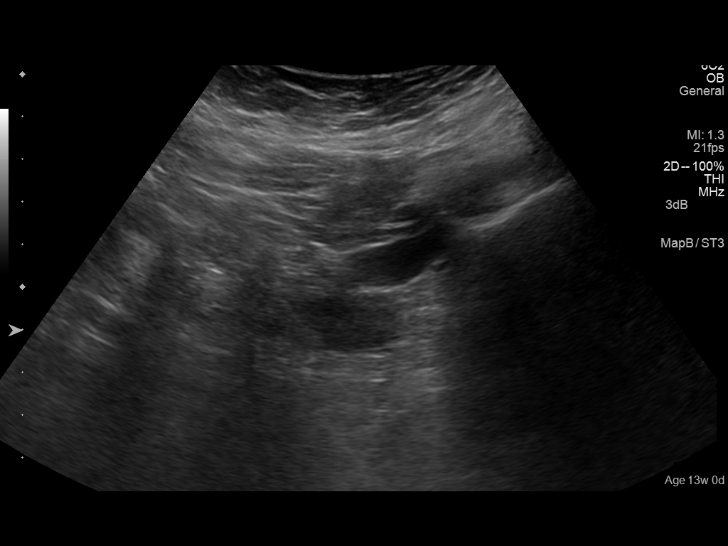
[im 24/72]
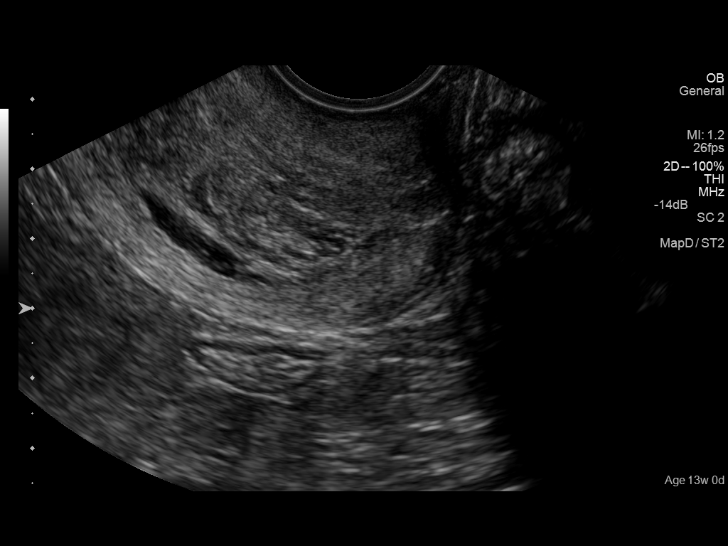
[im 29/72]
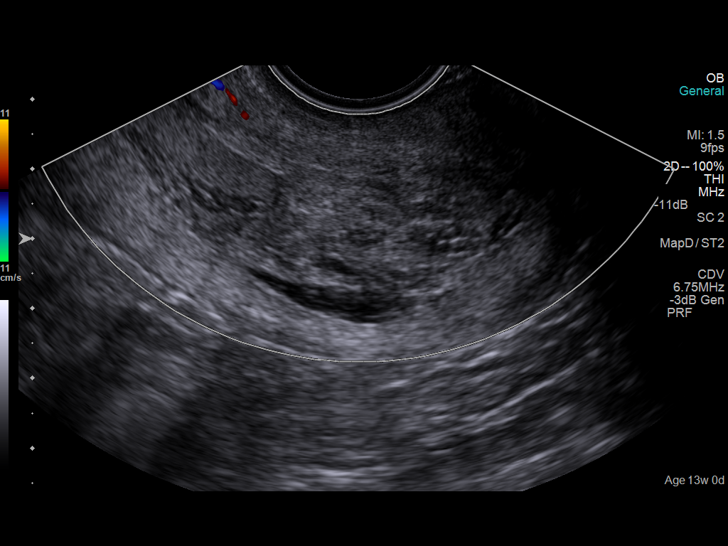
[im 35/72]
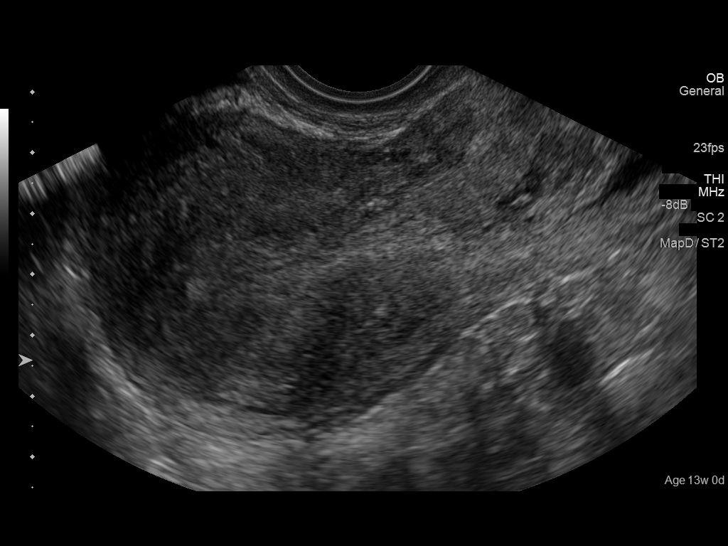
[im 40/72]
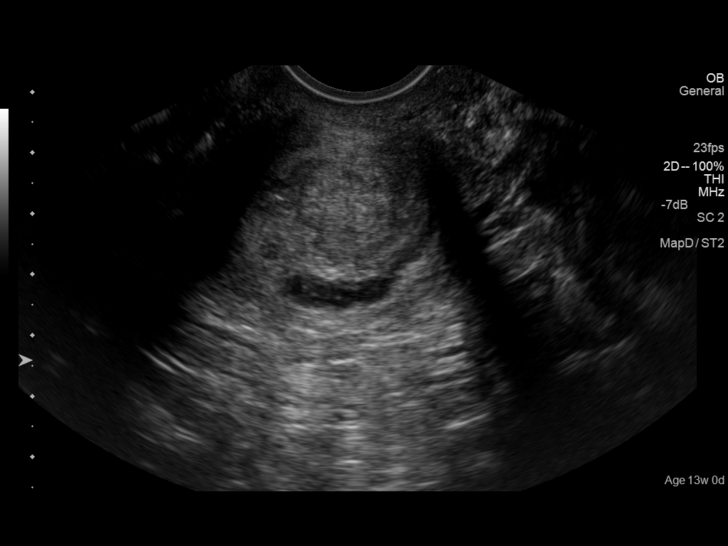
[im 45/72]
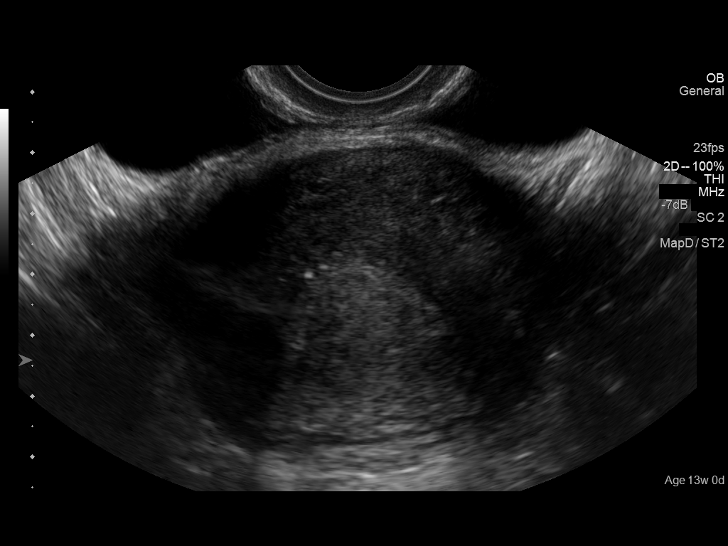
[im 50/72]
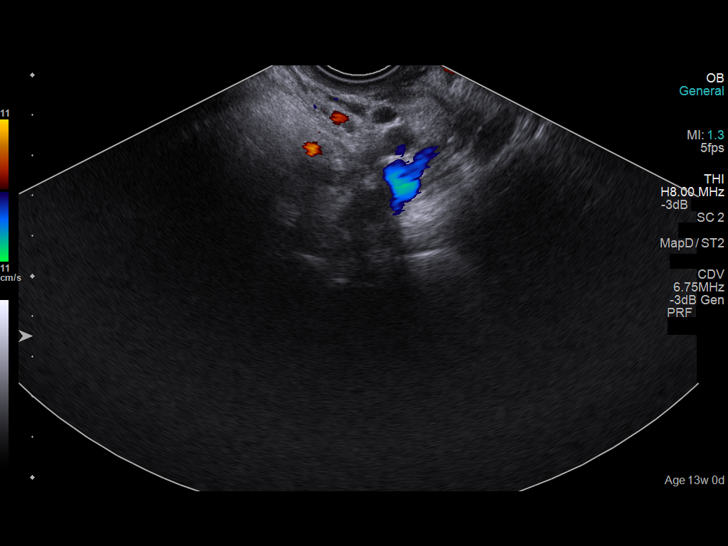
[im 56/72]
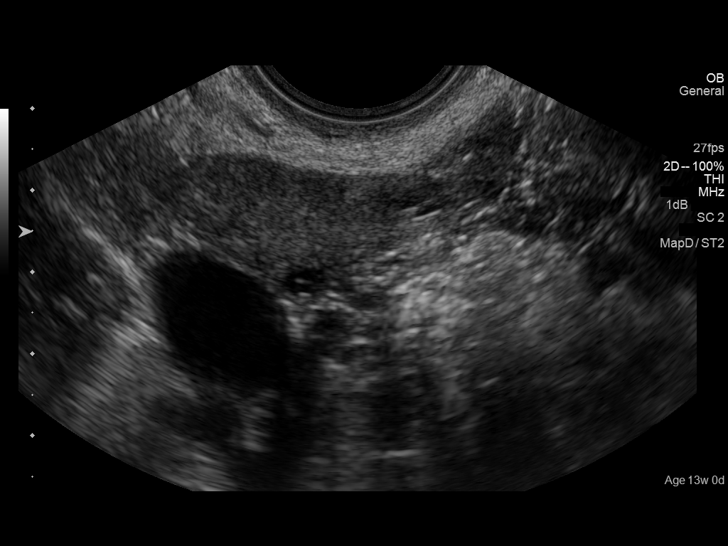
[im 61/72]
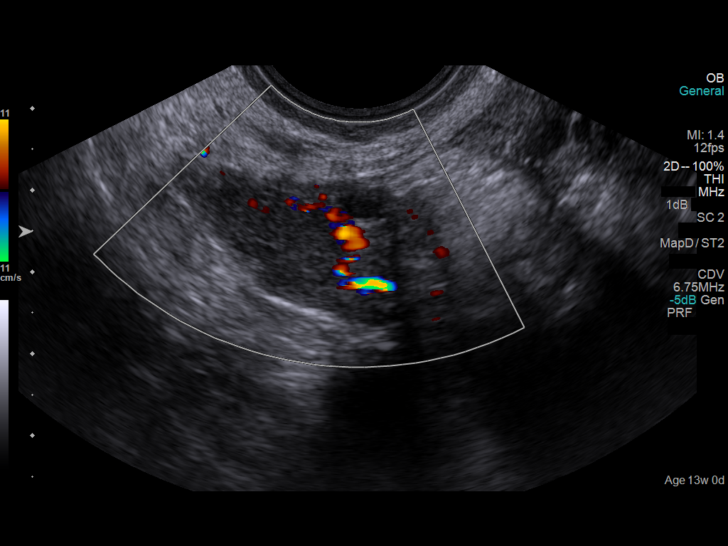
[im 66/72]
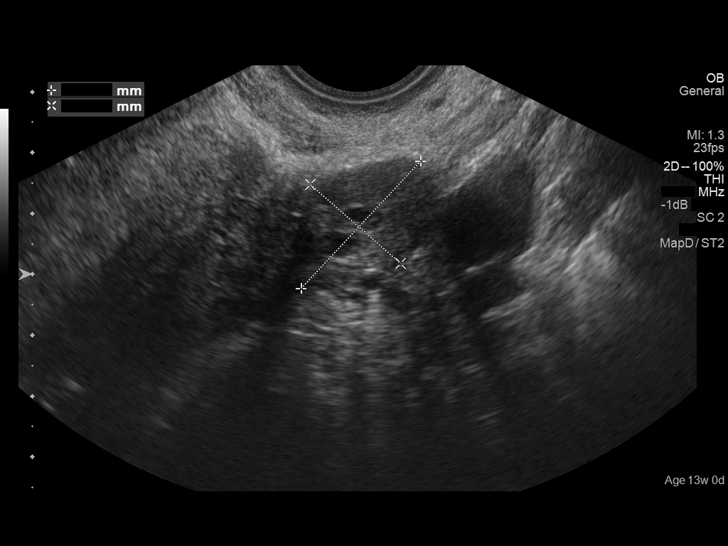
[im 72/72]
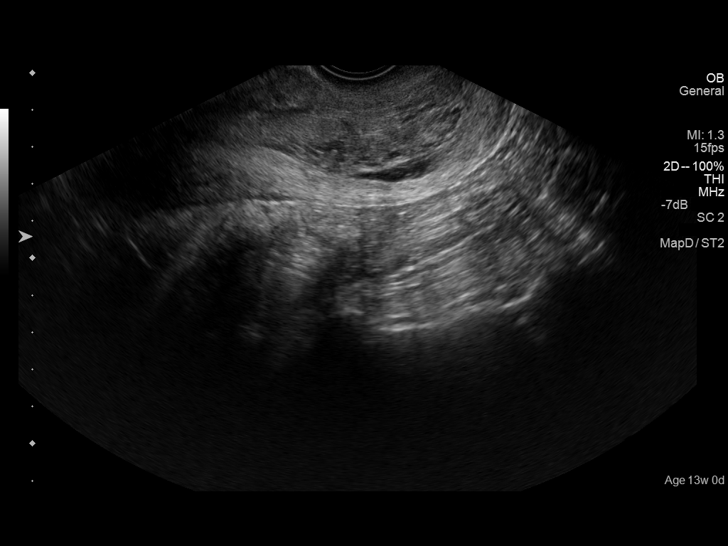

[14 of 28 positions shown; findings below may reference images not displayed]

FINDINGS: Intrauterine gestational sac: Not visualized

Yolk sac:  Not visualized

Embryo:  Not visualized

Cardiac Activity: Not appreciated

Heart Rate: Not applicable

Maternal uterus/adnexae: Diffuse increased echogenicity within the
endometrial canal with a more complex appearance the lower uterine
segment and cervical region. The comparison imaging demonstrates an
intrauterine gestational sac.
IMPRESSION: Findings consistent with a spontaneous abortion.

## 2015-10-19 ENCOUNTER — Telehealth: Payer: Self-pay | Admitting: Family Medicine

## 2015-10-19 NOTE — Telephone Encounter (Signed)
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 Pt has proof of having one MMR but not the second and [facility] is requiring her to have proof of having two. Pt has had two but is unable to provide proof for the one she had as a child. Pt has also had the titer as well. Pt is needing this to be able to attend clinicals on Monday.

## 2015-10-20 NOTE — Telephone Encounter (Signed)
Please confirm with mother her request and this information. 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 Put anything else on the letter that is necessary to help her with the job-related issue thank you also Iwill sign it afterwards thank you

## 2015-10-20 NOTE — Telephone Encounter (Signed)
Note written. Pt notified she can pick up today.

## 2015-10-20 NOTE — Telephone Encounter (Signed)
[facility]

## 2016-02-23 ENCOUNTER — Ambulatory Visit (INDEPENDENT_AMBULATORY_CARE_PROVIDER_SITE_OTHER): Payer: 59 | Admitting: Nurse Practitioner

## 2016-02-23 ENCOUNTER — Encounter: Payer: Self-pay | Admitting: Nurse Practitioner

## 2016-02-23 VITALS — BP 136/86 | Ht 68.5 in | Wt 231.2 lb

## 2016-02-23 DIAGNOSIS — R5383 Other fatigue: Secondary | ICD-10-CM

## 2016-02-23 MED ORDER — PHENTERMINE HCL 37.5 MG PO TABS
37.5000 mg | ORAL_TABLET | Freq: Every day | ORAL | 2 refills | Status: DC
Start: 2016-02-23 — End: 2018-02-10

## 2016-02-23 NOTE — Progress Notes (Signed)
Subjective:  Presents for c/o fatigue and weight gain more since she has been back in school. Had no trouble losing weight after her first 2 pregnancies. Has a 637 month old infant. Has stopped breastfeeding. Currently in nursing school. Has taken Phentermine before without difficulty. Emotionally feeling great.   Objective:   BP 136/86   Ht 5' 8.5" (1.74 m)   Wt 231 lb 3.2 oz (104.9 kg)   BMI 34.64 kg/m  NAD. Alert, oriented. Lungs clear. Heart RRR. Steady weight gain since the birth of her last child.   Assessment:   Problem List Items Addressed This Visit      Other   Morbid obesity (HCC)   Relevant Medications   phentermine (ADIPEX-P) 37.5 MG tablet    Other Visit Diagnoses    Other fatigue    -  Primary       Plan:  Meds ordered this encounter  Medications  . phentermine (ADIPEX-P) 37.5 MG tablet    Sig: Take 1 tablet (37.5 mg total) by mouth daily before breakfast.    Dispense:  30 tablet    Refill:  2    Order Specific Question:   Supervising Provider    Answer:   Merlyn AlbertLUKING, WILLIAM S [[id number]]   Encouraged healthy diet and regular activity. Recheck in 3 months if she wishes to continue medication.

## 2016-02-23 NOTE — Patient Instructions (Signed)
Belviq Qsymia Contrave 

## 2016-07-30 ENCOUNTER — Ambulatory Visit (INDEPENDENT_AMBULATORY_CARE_PROVIDER_SITE_OTHER): Payer: 59 | Admitting: *Deleted

## 2016-07-30 DIAGNOSIS — Z111 Encounter for screening for respiratory tuberculosis: Secondary | ICD-10-CM

## 2016-08-01 LAB — TB SKIN TEST
INDURATION: 0 mm
TB Skin Test: NEGATIVE

## 2016-09-20 ENCOUNTER — Encounter (INDEPENDENT_AMBULATORY_CARE_PROVIDER_SITE_OTHER): Payer: Self-pay

## 2016-10-17 ENCOUNTER — Telehealth: Payer: Self-pay | Admitting: Family Medicine

## 2016-10-17 NOTE — Telephone Encounter (Signed)
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

## 2016-10-18 NOTE — Telephone Encounter (Signed)
We will go ahead and do letter, I printed off some useful information from the [facility] website stating that many vaccines can still be given with breast-feeding. This may be useful for the patient to read over.

## 2016-10-18 NOTE — Telephone Encounter (Signed)
Pt notified letter and info ready for pickup. Letter copied to be scanned into chart

## 2016-11-02 ENCOUNTER — Other Ambulatory Visit: Payer: Self-pay | Admitting: Nurse Practitioner

## 2017-01-24 ENCOUNTER — Encounter: Payer: Self-pay | Admitting: Family Medicine

## 2017-10-14 ENCOUNTER — Encounter: Payer: Self-pay | Admitting: Family Medicine

## 2017-10-14 ENCOUNTER — Ambulatory Visit (INDEPENDENT_AMBULATORY_CARE_PROVIDER_SITE_OTHER): Payer: Commercial Managed Care - PPO | Admitting: Family Medicine

## 2017-10-14 VITALS — BP 128/88 | Temp 98.6°F | Ht 68.5 in | Wt 230.0 lb

## 2017-10-14 DIAGNOSIS — N3001 Acute cystitis with hematuria: Secondary | ICD-10-CM

## 2017-10-14 DIAGNOSIS — R300 Dysuria: Secondary | ICD-10-CM

## 2017-10-14 MED ORDER — NITROFURANTOIN MONOHYD MACRO 100 MG PO CAPS: ORAL_CAPSULE | ORAL | 0 refills | Status: DC

## 2017-10-14 NOTE — Progress Notes (Signed)
   Subjective:    Patient ID: Kristin Buckley, female    DOB: 12/09/1980, 37 y.o.   MRN: [id number]  HPI  Patient is here today for what she thinks is a UTI. She states she started having painful urination and abd pain yesterday. She has been taking over the counter AZO. Urine dipped,but has too much azo in to do dipstick.  Two days ago , hit ,a ndreally startd  Yrs since the  [place]  No fever  Chills   No nausea  Feeling need to go mor reg  Azo helped some with the presure  Review of Systems No headache, no major weight loss or weight gain, no chest pain no back pain abdominal pain no change in bowel habits complete ROS otherwise negative     Objective:   Physical Exam Alert vitals stable, NAD. Blood pressure good on repeat. HEENT normal. Lungs clear. Heart regular rate and rhythm.         Assessment & Plan:  Impression urinary tract infection.  Doubt renal involvement.  Discussed.  Antibiotics prescribed.  Symptom care discussed warning signs discussed

## 2018-02-10 ENCOUNTER — Ambulatory Visit: Payer: Commercial Managed Care - PPO | Admitting: Family Medicine

## 2018-02-10 ENCOUNTER — Encounter: Payer: Self-pay | Admitting: Family Medicine

## 2018-02-10 VITALS — BP 124/88 | Temp 98.9°F | Wt 229.8 lb

## 2018-02-10 DIAGNOSIS — J111 Influenza due to unidentified influenza virus with other respiratory manifestations: Secondary | ICD-10-CM | POA: Diagnosis not present

## 2018-02-10 MED ORDER — OSELTAMIVIR PHOSPHATE 75 MG PO CAPS: 75.0000 mg | ORAL_CAPSULE | Freq: Two times a day (BID) | ORAL | 0 refills | Status: AC

## 2018-02-10 NOTE — Patient Instructions (Signed)

## 2018-02-10 NOTE — Progress Notes (Signed)
   Subjective:    Patient ID: Kristin Buckley, female    DOB: 02/29/1980, 37 y.o.   MRN: [id number]  Cough  This is a new problem. The current episode started in the past 7 days. The cough is productive of sputum. Associated symptoms include chills, a fever, myalgias, a sore throat and shortness of breath. Pertinent negatives include no ear pain. Associated symptoms comments: Chest tightness; feels like she is breathing hot air. Treatments tried: 104.3 fever; yesterday 103; 101.8 this morning. pt states she will spike a fever and then it will come down. The treatment provided mild relief.   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 Was given breathing treatment at hospital and that did help with shortness of breath.   2 days ago started with congestion, chest tightness, and shortness of breath while at work. Doctor she was working with ordered a chest x-ray and breathing treatment, as well as d-dimer which was 0.42, all normal per pt. Sunday night started with fever 104.3, yesterday fever 101-103. This morning fever of 104.7. Felt like she couldn't move d/t body aches with fever. Feels very tight in her chest. Cough productive of thick yellow mucous, not as much this morning. Feels like she's breathing hot air. Still a little short of breath. Denies h/a. Reports tenderness to RUQ/ rib cage area started yesterday. Decreased energy.  Taking elderberry syrup and oscillococcinum as well as tylenol/motrin.     Review of Systems  Constitutional: Positive for chills, fatigue and fever.  HENT: Positive for congestion and sore throat. Negative for ear pain, sinus pressure and sinus pain.   Respiratory: Positive for cough and shortness of breath.   Gastrointestinal: Negative for diarrhea, nausea and vomiting.  Musculoskeletal: Positive for myalgias.       Objective:   Physical Exam Vitals signs and nursing note reviewed.  Constitutional:      General: She is  not in acute distress. HENT:     Head: Normocephalic and atraumatic.     Nose: Nose normal.     Mouth/Throat:     Mouth: Mucous membranes are moist.     Pharynx: Posterior oropharyngeal erythema present.  Eyes:     General:        Right eye: No discharge.        Left eye: No discharge.  Neck:     Musculoskeletal: Neck supple. No neck rigidity.  Cardiovascular:     Rate and Rhythm: Normal rate and regular rhythm.     Heart sounds: Normal heart sounds.  Pulmonary:     Effort: Pulmonary effort is normal. No respiratory distress.     Breath sounds: Normal breath sounds. No wheezing, rhonchi or rales.  Lymphadenopathy:     Cervical: No cervical adenopathy.  Skin:    General: Skin is warm and dry.  Neurological:     Mental Status: She is alert and oriented to person, place, and time.           Assessment & Plan:  Influenza  Discussed likely flu diagnosis, recommend treatment with tamiflu. Warning signs discussed. Symptomatic care discussed. F/u if symptoms worsen or fail to improve.   Dr. Lilyan PuntScott Luking was consulted on this case and is in agreement with the above treatment plan.

## 2018-04-07 ENCOUNTER — Telehealth: Payer: Self-pay | Admitting: Family Medicine

## 2018-04-07 ENCOUNTER — Other Ambulatory Visit: Payer: Self-pay | Admitting: *Deleted

## 2018-04-07 MED ORDER — AZITHROMYCIN 250 MG PO TABS: ORAL_TABLET | ORAL | 0 refills | Status: DC

## 2018-04-07 NOTE — Telephone Encounter (Signed)
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 She just started a new job and doesn't have the time available to take off.   Her head is stopped up, pressure, and low grade fever, no cough, no sore throat  Please send to [facility] - [place], [place] - [address]

## 2018-04-07 NOTE — Telephone Encounter (Signed)
Please advise 

## 2018-04-07 NOTE — Telephone Encounter (Signed)
Med sent to pharm. Pt notified med sent and if ongoing trouble follow up in office.

## 2018-04-07 NOTE — Telephone Encounter (Signed)
We can do a Z-Pak If the ache she has ongoing trouble she will need to be seen

## 2018-06-10 ENCOUNTER — Telehealth: Payer: Self-pay

## 2018-06-10 NOTE — Telephone Encounter (Signed)
Left message to call Rylend Pietrzak at [phone].  Call to patient regarding scheduled new patient appointment with Dr.Miller on May 7th, 2020 due to heavy menses.

## 2018-07-29 ENCOUNTER — Encounter: Payer: Self-pay | Admitting: Obstetrics & Gynecology

## 2018-07-29 NOTE — Telephone Encounter (Signed)
Left message to call Keari Miu at [phone]. 

## 2018-08-03 NOTE — Telephone Encounter (Signed)
Routing to [place]. This was another patient referral request. Patient has not returned call. Encounter closed.

## 2018-09-10 ENCOUNTER — Ambulatory Visit (INDEPENDENT_AMBULATORY_CARE_PROVIDER_SITE_OTHER): Payer: BC Managed Care – PPO | Admitting: Otolaryngology

## 2018-09-10 DIAGNOSIS — H60332 Swimmer's ear, left ear: Secondary | ICD-10-CM

## 2018-09-24 ENCOUNTER — Ambulatory Visit (INDEPENDENT_AMBULATORY_CARE_PROVIDER_SITE_OTHER): Payer: BC Managed Care – PPO | Admitting: Otolaryngology

## 2018-09-24 ENCOUNTER — Other Ambulatory Visit: Payer: Self-pay

## 2018-09-24 DIAGNOSIS — H93293 Other abnormal auditory perceptions, bilateral: Secondary | ICD-10-CM

## 2018-09-24 DIAGNOSIS — H60332 Swimmer's ear, left ear: Secondary | ICD-10-CM | POA: Diagnosis not present

## 2018-11-08 DIAGNOSIS — Z20828 Contact with and (suspected) exposure to other viral communicable diseases: Secondary | ICD-10-CM | POA: Diagnosis not present

## 2018-11-12 ENCOUNTER — Encounter: Payer: BC Managed Care – PPO | Admitting: Obstetrics & Gynecology

## 2018-11-13 ENCOUNTER — Other Ambulatory Visit: Payer: Self-pay | Admitting: *Deleted

## 2018-11-13 DIAGNOSIS — Z20822 Contact with and (suspected) exposure to covid-19: Secondary | ICD-10-CM

## 2018-11-13 DIAGNOSIS — Z20828 Contact with and (suspected) exposure to other viral communicable diseases: Secondary | ICD-10-CM | POA: Diagnosis not present

## 2018-11-14 LAB — NOVEL CORONAVIRUS, NAA: SARS-CoV-2, NAA: NOT DETECTED

## 2018-11-19 ENCOUNTER — Other Ambulatory Visit: Payer: Self-pay

## 2018-11-19 DIAGNOSIS — Z20822 Contact with and (suspected) exposure to covid-19: Secondary | ICD-10-CM

## 2018-11-19 DIAGNOSIS — Z20828 Contact with and (suspected) exposure to other viral communicable diseases: Secondary | ICD-10-CM | POA: Diagnosis not present

## 2018-11-21 LAB — NOVEL CORONAVIRUS, NAA: SARS-CoV-2, NAA: NOT DETECTED

## 2018-12-24 ENCOUNTER — Other Ambulatory Visit: Payer: Self-pay

## 2018-12-28 ENCOUNTER — Ambulatory Visit: Payer: BC Managed Care – PPO | Admitting: Obstetrics & Gynecology

## 2018-12-28 ENCOUNTER — Encounter: Payer: Self-pay | Admitting: Obstetrics & Gynecology

## 2018-12-28 ENCOUNTER — Other Ambulatory Visit (HOSPITAL_COMMUNITY)
Admission: RE | Admit: 2018-12-28 | Discharge: 2018-12-28 | Disposition: A | Discharge status: Home / Self Care | Payer: BC Managed Care – PPO | Source: Ambulatory Visit | Attending: Obstetrics & Gynecology | Admitting: Obstetrics & Gynecology

## 2018-12-28 ENCOUNTER — Other Ambulatory Visit: Payer: Self-pay

## 2018-12-28 VITALS — BP 130/72 | HR 76 | Temp 97.6°F | Resp 12 | Ht 69.0 in | Wt 235.6 lb

## 2018-12-28 DIAGNOSIS — Z124 Encounter for screening for malignant neoplasm of cervix: Secondary | ICD-10-CM

## 2018-12-28 DIAGNOSIS — Z01419 Encounter for gynecological examination (general) (routine) without abnormal findings: Secondary | ICD-10-CM

## 2018-12-28 DIAGNOSIS — Z0000 Encounter for general adult medical examination without abnormal findings: Secondary | ICD-10-CM | POA: Diagnosis not present

## 2018-12-28 MED ORDER — FLUCONAZOLE 150 MG PO TABS: ORAL_TABLET | ORAL | 0 refills | Status: DC

## 2018-12-28 MED ORDER — FLUOXETINE HCL 10 MG PO TABS: ORAL_TABLET | ORAL | 3 refills | Status: DC

## 2018-12-28 MED ORDER — TRIAMCINOLONE ACETONIDE 0.5 % EX CREA: 1.0000 "application " | TOPICAL_CREAM | Freq: Three times a day (TID) | CUTANEOUS | 1 refills | Status: DC

## 2018-12-28 NOTE — Progress Notes (Signed)
38 y.o. [id number] Married White or Caucasian female here as a new patient for an annual exam. Patient complains of having a rash at the panty line that burns and itches.   She used to see Dr. Adah Perl.  This was with her last pregnancy.    She is an Therapist, sports.    Had BTL with last cesarean section.  Reports cycles are heavier since last cesarean section.  She does having mood issues prior to cycle starting.    Patient's last menstrual period was 11/30/2018.          Sexually active: Yes.    The current method of family planning is tubal ligation.    Exercising: Yes.    zumba Smoker:  no  Health Maintenance: Pap:  About 3 years -- normal per patient History of abnormal Pap:  Yes, hx of Cryo (2011) TDaP:  UTD Screening Labs: discuss today   reports that she has never smoked. She has never used smokeless tobacco. She reports that she does not drink alcohol or use drugs.  Past Medical History:  Diagnosis Date  . Abnormal Pap smear   . Anxiety   . Hx of urinary tract infection   . Irregular menses 05/04/2012  . Ovarian cyst 2012   right  . Superficial thrombosis of leg     Past Surgical History:  Procedure Laterality Date  . CESAREAN SECTION    . CRYOTHERAPY    . DILATION AND CURETTAGE OF UTERUS  2005  . TUBAL LIGATION      Current Outpatient Medications  Medication Sig Dispense Refill  . FLUoxetine (PROZAC) 10 MG tablet Take 1 tablet days 14-28 of cycle. 14 tablet 3   No current facility-administered medications for this visit.     Family History  Problem Relation Age of Onset  . Hypertension Mother   . Diabetes Paternal Grandmother   . Breast cancer Paternal Grandmother        later in life  . Diabetes Paternal Grandfather   . Heart disease Father     Review of Systems  Constitutional: Negative.   HENT: Negative.   Eyes: Negative.   Respiratory: Negative.   Cardiovascular: Negative.   Gastrointestinal: Negative.   Endocrine: Negative.   Genitourinary: Negative.    Musculoskeletal: Negative.   Skin: Positive for rash.  Allergic/Immunologic: Negative.   Neurological: Negative.   Hematological: Negative.   Psychiatric/Behavioral: Negative.     Exam:   BP 130/72 (BP Location: Right Arm, Patient Position: Sitting, Cuff Size: Normal)   Pulse 76   Temp 97.6 F (36.4 C) (Temporal)   Resp 12   Ht 5\' 9"  (1.753 m)   Wt 235 lb 9.6 oz (106.9 kg)   LMP 11/30/2018   BMI 34.79 kg/m      Height: 5\' 9"  (175.3 cm)  Ht Readings from Last 3 Encounters:  12/28/18 5\' 9"  (1.753 m)  10/14/17 5' 8.5" (1.74 m)  02/23/16 5' 8.5" (1.74 m)    General appearance: alert, cooperative and appears stated age Head: Normocephalic, without obvious abnormality, atraumatic Neck: no adenopathy, supple, symmetrical, trachea midline and thyroid normal to inspection and palpation Lungs: clear to auscultation bilaterally Breasts: normal appearance, no masses or tenderness Heart: regular rate and rhythm Abdomen: soft, non-tender; bowel sounds normal; no masses,  no organomegaly Extremities: extremities normal, atraumatic, no cyanosis or edema Skin: Skin color, texture, turgor normal. No rashes or lesions Lymph nodes: Cervical, supraclavicular, and axillary nodes normal. No abnormal inguinal nodes palpated Neurologic: Grossly normal  Pelvic: External genitalia:  no lesions              Urethra:  normal appearing urethra with no masses, tenderness or lesions              Bartholins and Skenes: normal                 Vagina: normal appearing vagina with normal color and discharge, no lesions              Cervix: no lesions              Pap taken: Yes.   Bimanual Exam:  Uterus:  normal size, contour, position, consistency, mobility, non-tender              Adnexa: normal adnexa and no mass, fullness, tenderness               Rectovaginal: Confirms               Anus:  normal sphincter tone, no lesions  Chaperone was present for exam.  A:  Well Woman with normal  exam PMDD/PMS H/o cesarean section x 3 Family hx of breast cancer in her PGM (later in life) Inner thigh rash  P:   Mammogram guidelines reviewed pap smear and HR HPV obtained today Trial of Prozac 10mg  days 14-28 through cycle Lab work obtained today:  CBC, CMP, Lipids, TSH and Vit D Triamcinolone cream 0.5% tid for next 7-10 days and Diflucan 150mg  po x 1, repeat every 72 hours for 3 doses. Return annually or prn

## 2018-12-28 NOTE — Patient Instructions (Signed)
Endometrial Ablation Endometrial ablation is a procedure that destroys the thin inner layer of the lining of the uterus (endometrium). This procedure may be done:  To stop heavy periods.  To stop bleeding that is causing anemia.  To control irregular bleeding.  To treat bleeding caused by small tumors (fibroids) in the endometrium. This procedure is often an alternative to major surgery, such as removal of the uterus and cervix (hysterectomy). As a result of this procedure:  You may not be able to have children. However, if you are premenopausal (you have not gone through menopause): ? You may still have a small chance of getting pregnant. ? You will need to use a reliable method of birth control after the procedure to prevent pregnancy.  You may stop having a menstrual period, or you may have only a small amount of bleeding during your period. Menstruation may return several years after the procedure. Tell a health care provider about:  Any allergies you have.  All medicines you are taking, including vitamins, herbs, eye drops, creams, and over-the-counter medicines.  Any problems you or family members have had with the use of anesthetic medicines.  Any blood disorders you have.  Any surgeries you have had.  Any medical conditions you have. What are the risks? Generally, this is a safe procedure. However, problems may occur, including:  A hole (perforation) in the uterus or bowel.  Infection of the uterus, bladder, or vagina.  Bleeding.  Damage to other structures or organs.  An air bubble in the lung (air embolus).  Problems with pregnancy after the procedure.  Failure of the procedure.  Decreased ability to diagnose cancer in the endometrium. What happens before the procedure?  You will have tests of your endometrium to make sure there are no pre-cancerous cells or cancer cells present.  You may have an ultrasound of the uterus.  You may be given medicines to  thin the endometrium.  Ask your health care provider about: ? Changing or stopping your regular medicines. This is especially important if you take diabetes medicines or blood thinners. ? Taking medicines such as aspirin and ibuprofen. These medicines can thin your blood. Do not take these medicines before your procedure if your doctor tells you not to.  Plan to have someone take you home from the hospital or clinic. What happens during the procedure?   You will lie on an exam table with your feet and legs supported as in a pelvic exam.  To lower your risk of infection: ? Your health care team will wash or sanitize their hands and put on germ-free (sterile) gloves. ? Your genital area will be washed with soap.  An IV tube will be inserted into one of your veins.  You will be given a medicine to help you relax (sedative).  A surgical instrument with a light and camera (resectoscope) will be inserted into your vagina and moved into your uterus. This allows your surgeon to see inside your uterus.  Endometrial tissue will be removed using one of the following methods: ? Radiofrequency. This method uses a radiofrequency-alternating electric current to remove the endometrium. ? Cryotherapy. This method uses extreme cold to freeze the endometrium. ? Heated-free liquid. This method uses a heated saltwater (saline) solution to remove the endometrium. ? Microwave. This method uses high-energy microwaves to heat up the endometrium and remove it. ? Thermal balloon. This method involves inserting a catheter with a balloon tip into the uterus. The balloon tip is filled with   heated fluid to remove the endometrium. The procedure may vary among health care providers and hospitals. What happens after the procedure?  Your blood pressure, heart rate, breathing rate, and blood oxygen level will be monitored until the medicines you were given have worn off.  As tissue healing occurs, you may notice  vaginal bleeding for 4-6 weeks after the procedure. You may also experience: ? Cramps. ? Thin, watery vaginal discharge that is light pink or brown in color. ? A need to urinate more frequently than usual. ? Nausea.  Do not drive for 24 hours if you were given a sedative.  Do not have sex or insert anything into your vagina until your health care provider approves. Summary  Endometrial ablation is done to treat the many causes of heavy menstrual bleeding.  The procedure may be done only after medications have been tried to control the bleeding.  Plan to have someone take you home from the hospital or clinic. This information is not intended to replace advice given to you by your health care provider. Make sure you discuss any questions you have with your health care provider. Document Released: 12/08/2003 Document Revised: 07/15/2017 Document Reviewed: 02/15/2016 Elsevier Patient Education  2020 Elsevier Inc.  

## 2018-12-29 LAB — COMPREHENSIVE METABOLIC PANEL
ALT: 14 IU/L (ref 0–32)
AST: 26 IU/L (ref 0–40)
Albumin/Globulin Ratio: 1.7 (ref 1.2–2.2)
Albumin: 4.3 g/dL (ref 3.8–4.8)
Alkaline Phosphatase: 58 IU/L (ref 39–117)
BUN/Creatinine Ratio: 14 (ref 9–23)
BUN: 9 mg/dL (ref 6–20)
Bilirubin Total: 0.7 mg/dL (ref 0.0–1.2)
CO2: 22 mmol/L (ref 20–29)
Calcium: 9.7 mg/dL (ref 8.7–10.2)
Chloride: 102 mmol/L (ref 96–106)
Creatinine, Ser: 0.64 mg/dL (ref 0.57–1.00)
GFR calc Af Amer: 131 mL/min/{1.73_m2} (ref >59)
GFR calc non Af Amer: 114 mL/min/{1.73_m2} (ref >59)
Globulin, Total: 2.6 g/dL (ref 1.5–4.5)
Glucose: 84 mg/dL (ref 65–99)
Potassium: 4.8 mmol/L (ref 3.5–5.2)
Sodium: 141 mmol/L (ref 134–144)
Total Protein: 6.9 g/dL (ref 6.0–8.5)

## 2018-12-29 LAB — CBC
Hematocrit: 37.7 % (ref 34.0–46.6)
Hemoglobin: 12.5 g/dL (ref 11.1–15.9)
MCH: 27.7 pg (ref 26.6–33.0)
MCHC: 33.2 g/dL (ref 31.5–35.7)
MCV: 83 fL (ref 79–97)
Platelets: 245 10*3/uL (ref 150–450)
RBC: 4.52 x10E6/uL (ref 3.77–5.28)
RDW: 12.7 % (ref 11.7–15.4)
WBC: 6.5 10*3/uL (ref 3.4–10.8)

## 2018-12-29 LAB — LIPID PANEL
Chol/HDL Ratio: 3.1 ratio (ref 0.0–4.4)
Cholesterol, Total: 176 mg/dL (ref 100–199)
HDL: 57 mg/dL (ref >39)
LDL Chol Calc (NIH): 104 mg/dL — ABNORMAL HIGH (ref 0–99)
Triglycerides: 83 mg/dL (ref 0–149)
VLDL Cholesterol Cal: 15 mg/dL (ref 5–40)

## 2018-12-29 LAB — VITAMIN D 25 HYDROXY (VIT D DEFICIENCY, FRACTURES): Vit D, 25-Hydroxy: 36.3 ng/mL (ref 30.0–100.0)

## 2018-12-29 LAB — TSH: TSH: 1.76 u[IU]/mL (ref 0.450–4.500)

## 2018-12-30 ENCOUNTER — Telehealth: Payer: Self-pay | Admitting: Obstetrics & Gynecology

## 2018-12-30 LAB — CYTOLOGY - PAP
Comment: NEGATIVE
Diagnosis: NEGATIVE
High risk HPV: NEGATIVE

## 2018-12-30 NOTE — Telephone Encounter (Signed)
Spoke with patient. Calling for 12/28/18 lab results. Advised patient pap is pending. CBC, CMP, TSH and Vit D WNL.  LDL 104, normal is less than 99. Advised patient Dr. Sabra Heck still needs to review labs and make final recommendations, our office will notify of final results and recommendations. Patient verbalizes understanding and is agreeable.   Routing to provider for final review. Patient is agreeable to disposition. Will close encounter.

## 2018-12-30 NOTE — Telephone Encounter (Signed)
Patient calling for test results. °

## 2019-01-05 ENCOUNTER — Encounter: Payer: BC Managed Care – PPO | Admitting: Obstetrics & Gynecology

## 2019-01-27 ENCOUNTER — Encounter: Payer: Self-pay | Admitting: Obstetrics & Gynecology

## 2019-01-27 ENCOUNTER — Telehealth: Payer: Self-pay | Admitting: Obstetrics & Gynecology

## 2019-01-27 DIAGNOSIS — Z01419 Encounter for gynecological examination (general) (routine) without abnormal findings: Secondary | ICD-10-CM

## 2019-01-27 NOTE — Telephone Encounter (Signed)
Patient sent the following message through [place]. Routing to triage to assist patient with request.  [place], [place]  Phone Number: [phone]  [facility] Dr. Sabra Heck, could you please call me at your convenience in regards to my medication.   Thank you.

## 2019-01-27 NOTE — Telephone Encounter (Signed)
Spoke with pt. Pt had question re: Prozac. Pt had Rx filled, but hasn't taken it.  Pt feels "hesitant" about taking it. Pt states having mood swings, irritability  and anxiety.  Wants to know if can change to Cymbalta because she has taken this in the past and it worked, but wants Dr Ammie Ferrier recommendation and is open to another medication if not Cymbalta.   Will route to Dr Sabra Heck for recommendations. Please advise.

## 2019-01-29 NOTE — Telephone Encounter (Signed)
It's totally fine to start cymbalta instead of prozac.  Would start 30mg  daily.  Needs follow up one month after starting.  Web ex is fine.

## 2019-01-29 NOTE — Telephone Encounter (Signed)
Call to patient, no answer, mailbox full, unable to leave message.  

## 2019-02-01 NOTE — Telephone Encounter (Signed)
Spoke to pt. Pt given information on starting Cymbalta. Pt agreeable.  Pt will start tomorrow. Pt scheduled for 1 month f/u mychart video visit on 03/05/2019 at 1130 with Dr Sabra Heck. Pt agreeable.   Will route to Dr Sabra Heck for pended Rx for Cymbalta. # 30 capsules, 30mg  with  0 RF if approved. Pharmacy on file verified.

## 2019-02-03 ENCOUNTER — Encounter: Payer: Self-pay | Admitting: Obstetrics & Gynecology

## 2019-02-03 ENCOUNTER — Telehealth: Payer: Self-pay | Admitting: Obstetrics & Gynecology

## 2019-02-03 MED ORDER — DULOXETINE HCL 30 MG PO CPEP: 30.0000 mg | ORAL_CAPSULE | Freq: Every day | ORAL | 0 refills | Status: DC

## 2019-02-03 NOTE — Telephone Encounter (Signed)
Patient sent the following message through [place]. Routing to triage to assist patient with request.  [place], [place] [facility]  Phone Number: [phone]  Good morning, I'm sorry to keep bothering you. I have went to my pharmacy the past 2 days and they are saying they still haven't received a prescription for the Cymbalta.

## 2019-02-03 NOTE — Telephone Encounter (Signed)
See open phone encounter 01/27/19. Encounter closed.

## 2019-02-03 NOTE — Telephone Encounter (Signed)
Signed       Patient sent the following message through [place]. Routing to triage to assist patient with request.   [place], [place] [facility]  Phone Number: [phone]  Good morning, I'm sorry to keep bothering you. I have went to my pharmacy the past 2 days and they are saying they still haven't received a prescription for the Cymbalta.    Spoke with pt. Pt notified Rx for Cymbalta # 30 with 0RF sent to pharmacy on file per phone encounter on 01/29/19.  1 month f/u made for 03/05/2019 with Dr Sabra Heck. Pt agreeable.   Will route to Dr Sabra Heck and encounter closed.

## 2019-03-05 ENCOUNTER — Encounter: Payer: Self-pay | Admitting: Obstetrics & Gynecology

## 2019-03-05 ENCOUNTER — Telehealth (INDEPENDENT_AMBULATORY_CARE_PROVIDER_SITE_OTHER): Payer: BC Managed Care – PPO | Admitting: Obstetrics & Gynecology

## 2019-03-05 DIAGNOSIS — F3281 Premenstrual dysphoric disorder: Secondary | ICD-10-CM | POA: Diagnosis not present

## 2019-03-05 MED ORDER — DULOXETINE HCL 30 MG PO CPEP: 30.0000 mg | ORAL_CAPSULE | Freq: Every day | ORAL | 1 refills | Status: DC

## 2019-03-05 NOTE — Progress Notes (Signed)
Virtual Visit via Video Note  I connected with Kristin Buckley on 03/05/19 at 11:30 AM EST by a video enabled telemedicine application and verified that I am speaking with the correct person using two identifiers.  Location: Patient: work Provider: office   I discussed the limitations of evaluation and management by telemedicine and the availability of in person appointments. The patient expressed understanding and agreed to proceed.  History of Present Illness: 74 G9P3 MWF for video visit to discuss starting Cymbalta.  She started Cymbalta 30mg  on 02/15/2019.  She took this about 7 years ago.  She stopped this after some time when symptoms improved.  Feels more mellow just with three weeks of medications.  Has asked her husband and he reported the same symptom improvement.  Would like to stay on current dosage.  Will plan to give me a follow up in about three more weeks.  She knows this can be with mychart or visit if she desires.   Observations/Objective: WNWD WF, NAD  Assessment and Plan: 38 you G9P3 MWF with PMDD/PMS symptoms that are improved with Cymbalta  Follow Up Instructions: Rx for 30mg  1 po q day to pharmacy.  #30/1RF She will touch base again with me in about 3 weeks.    I discussed the assessment and treatment plan with the patient. The patient was provided an opportunity to ask questions and all were answered. The patient agreed with the plan and demonstrated an understanding of the instructions.   The patient was advised to call back or seek an in-person evaluation if the symptoms worsen or if the condition fails to improve as anticipated.  I provided 15 minutes of non-face-to-face time during this encounter.   04/15/2019, MD

## 2019-04-19 ENCOUNTER — Other Ambulatory Visit: Payer: Self-pay | Admitting: Obstetrics & Gynecology

## 2019-04-19 DIAGNOSIS — F3281 Premenstrual dysphoric disorder: Secondary | ICD-10-CM

## 2019-04-19 NOTE — Telephone Encounter (Signed)
Medication refill request: cymbalta 30mg  Last AEX:  12-28-2018 Next AEX: 01-03-2020 Last MMG (if hormonal medication request): n/a Refill authorized: rx was sent 03-05-19 #30 with 1 refill. Patient was to callback with report. Rx denied with reason for patient to call office.

## 2019-05-14 ENCOUNTER — Encounter: Payer: Self-pay | Admitting: Obstetrics & Gynecology

## 2019-05-17 ENCOUNTER — Telehealth: Payer: Self-pay | Admitting: Obstetrics & Gynecology

## 2019-05-17 NOTE — Telephone Encounter (Signed)
Patient sent the following correspondence through MyChart.  Hi Dr. Hyacinth Meeker, hope you are doing well. I wanted to touch base with the Cymbalta. I don't feel that it has made a noticeable difference but instead of increasing I would like to taper off. I've started some other things that are non medical, yoga and regular exercise along with diet change and I am feeling better.  Also, I am leaving for a quick getaway with my family and I am starting to feel like I may be getting a UTI. Is there something that you could call in for me?  I hope you enjoy Easter weekend. Thank you for all that you  do!

## 2019-05-17 NOTE — Telephone Encounter (Signed)
Left message for pt to return call to triage RN. 

## 2019-05-26 ENCOUNTER — Telehealth: Payer: Self-pay | Admitting: *Deleted

## 2019-05-26 NOTE — Telephone Encounter (Signed)
Call to patient in f/u to 4/5 MyChart message.  Left message, ok per dpr, name identified on voicemail. Advised patient I will provide an update to Dr. Hyacinth Meeker on cymbalta. If any UTI symptoms present, return call to office for OV or if any additional questions.   Routing to Dr. Gloris Ham.   Encounter closed.

## 2019-05-26 NOTE — Telephone Encounter (Signed)
Call to patient, mailbox full, unable to leave message.  

## 2019-05-26 NOTE — Telephone Encounter (Signed)
Patient says she is returning a call. °

## 2019-05-31 NOTE — Telephone Encounter (Signed)
Call to patient to provide Cymbalta instructions.  Voice mailbox is full. Unable to leave message.

## 2019-05-31 NOTE — Telephone Encounter (Signed)
She can take the cymbalta every other day for two weeks and then every third day for two weeks and then just stop.  Thanks.

## 2019-06-01 NOTE — Telephone Encounter (Signed)
Spoke with patient, advised per Dr. Hyacinth Meeker. Patient states she did taper off, is now off of Cymbalta and doing well. Patient contacted PCP for medication for UTI, symptoms have resolved. Patient thankful for f/u. Is aware to return call if any questions/concerns.   Medication list updated.   Routing to provider for final review. Patient is agreeable to disposition. Will close encounter.

## 2019-06-01 NOTE — Telephone Encounter (Signed)
See telephone encounter dated 05/17/19.   Encounter closed.

## 2019-06-01 NOTE — Addendum Note (Signed)
Addended by: Leda Min on: 06/01/2019 09:11 AM   Modules accepted: Orders

## 2019-07-02 ENCOUNTER — Telehealth: Payer: Self-pay

## 2019-07-02 MED ORDER — FLUCONAZOLE 150 MG PO TABS: 150.0000 mg | ORAL_TABLET | Freq: Once | ORAL | 0 refills | Status: AC

## 2019-07-02 NOTE — Telephone Encounter (Signed)
Spoke with pt. Pt given recommendations per Dr Hyacinth Meeker and new Rx for Diflucan. Pt agreeable and verbalized understanding. Pt will call to be seen next week if rash not resolved for OV.  Rx Diflucan 150mg  #2,0RF sent to pharmacy on file.  Routing to Dr .  Encounter closed.

## 2019-07-02 NOTE — Telephone Encounter (Signed)
I think it's ok to treat again with Diflucan 150mg  po x 1, repeat 72 hours if needed.  #2/0RF.  Should come in next week if not better.

## 2019-07-02 NOTE — Telephone Encounter (Signed)
Patient is calling in regards to skin rash on the bikini line. Patient was seen (12/28/18) and was prescribed a a medication Diflucan for the rash. Patient stated that the rash has returned and would like to know if she can have a prescription called in.

## 2019-07-02 NOTE — Telephone Encounter (Signed)
AEX Nov 2020  Spoke with pt. Pt reports having same rash on both sides of bikini area come up after being at [facility] this past weekend. Pt states same rash as in Nov at AEX. Pt denies any fever, chills. UTI sx, vaginal bleeding or discharge. Pt states is bothersome with some light itching, but doesn't want to get worse like last time and has used the left over Rx Kenalog cream she was prescribed in Nov and is better, but not gone. Pt states when she took 1 tab of Rx Diflucan, the rash was gone in 1 day in Nov. Pt requesting new Rx of Diflucan and Kenalog cream to help rash. Advised will review with Dr Hyacinth Meeker and return call to pt. Pt agreeable.   Routing to Dr Hyacinth Meeker.  Pharmacy verified.

## 2019-07-21 ENCOUNTER — Encounter: Payer: Self-pay | Admitting: Obstetrics & Gynecology

## 2019-07-22 ENCOUNTER — Telehealth: Payer: Self-pay

## 2019-07-22 NOTE — Telephone Encounter (Signed)
Pt sent following mychart message:  Braley, Luckenbaugh, MD 12 hours ago (8:57 PM)   Hi Dr. Sabra Heck. I have recently donated blood and I received a letter stating that I could no longer donate. I have Covid antibodies (I had covid a while back) but I also have HLA antibodies, it stated that this is connected to autoimmune disorders. Is this something I should be concerned with? Thank you for your time.

## 2019-07-22 NOTE — Telephone Encounter (Signed)
Spoke with pt. Pt sent mychart message. Pt states donating blood through [facility] in April and was + for Covid antibodies and a HLA antibodies. Not able to give PRBC, but can give plasma.   Pt wanting to know if there is concern for HLA antibodies and if needs further labs needed or referral due to this. Never had autoimmune disorder diagnosed.  Advised will review with Dr Sabra Heck and return call to pt. Pt agreeable.   Routing to Dr Sabra Heck.

## 2019-07-27 NOTE — Telephone Encounter (Signed)
Patient calling to follow up on last message.

## 2019-07-27 NOTE — Telephone Encounter (Signed)
Spoke with pt. Pt wanting update on HLA antibodies. Pt does not have copy of letter to send to [place] through mychart portal.   Routing to Dr Sabra Heck

## 2019-07-28 NOTE — Telephone Encounter (Signed)
Please let pt know I am not an expert on this and had to do some research.    HLA antibodies are antibodies to the human leukocyte antigen (HLA) carried on the white blood cells.  These are harmless to the person who carries the HLA antibodies.  They are also very common.  Almost 25% of women who have been pregnant carry them.  Pregnancy makes them more common.  Testing starting in 2016 from the [employer] for these antibodies because they increase the risk of a reaction to a transfusion by the recipient called TRALI--transfusion related acute lung injury.  It is rare but dangerous.  These are more commonly in plasma and platelets so she cannot donate these.  She should be able to continue to donate RBCs.  She said it differently in the message but my reading states RBC donation is still fine.  There is an information sheet by the [employer] called:  [employer] Information Sheet Transfusion-Related Acute Lung Injury (TRALI).  She could google this.  It's very helpful.

## 2019-07-28 NOTE — Telephone Encounter (Signed)
Spoke with pt. Pt given update and recommendations per Dr Miller. Pt agreeable and verbalized understanding.  Encounter closed. 

## 2019-08-06 ENCOUNTER — Ambulatory Visit
Admission: EM | Admit: 2019-08-06 | Discharge: 2019-08-06 | Disposition: A | Discharge status: Home / Self Care | Payer: BC Managed Care – PPO | Attending: Emergency Medicine | Admitting: Emergency Medicine

## 2019-08-06 DIAGNOSIS — R0981 Nasal congestion: Secondary | ICD-10-CM

## 2019-08-06 DIAGNOSIS — H66003 Acute suppurative otitis media without spontaneous rupture of ear drum, bilateral: Secondary | ICD-10-CM

## 2019-08-06 DIAGNOSIS — H9209 Otalgia, unspecified ear: Secondary | ICD-10-CM

## 2019-08-06 MED ORDER — FLUTICASONE PROPIONATE 50 MCG/ACT NA SUSP: 2.0000 | Freq: Every day | NASAL | 0 refills | Status: DC

## 2019-08-06 MED ORDER — CETIRIZINE HCL 10 MG PO TABS: 10.0000 mg | ORAL_TABLET | Freq: Every day | ORAL | 0 refills | Status: DC

## 2019-08-06 MED ORDER — AMOXICILLIN-POT CLAVULANATE 875-125 MG PO TABS: 1.0000 | ORAL_TABLET | Freq: Two times a day (BID) | ORAL | 0 refills | Status: AC

## 2019-08-06 NOTE — ED Provider Notes (Signed)
[facility]   [id number] 08/06/19 Arrival Time: 1250   CC: URI  SUBJECTIVE: History from: patient.  Kristin Buckley is a 39 y.o. female who presents with sinus pain, pressure, and ear pain x 3 days.  Family with similar symptoms.  Has tried sudafed without relief.  Symptoms are made worse at night.  Reports previous symptoms in the past with sinus infection.   Denies fever, chills, sore throat, SOB, wheezing, chest pain, nausea, vomiting, changes in bowel or bladder habits.    ROS: As per HPI.  All other pertinent ROS negative.     Past Medical History:  Diagnosis Date  . Abnormal Pap smear   . Anxiety   . Hx of urinary tract infection   . Irregular menses 05/04/2012  . Ovarian cyst 2012   right  . Superficial thrombosis of leg    Past Surgical History:  Procedure Laterality Date  . CESAREAN SECTION    . CRYOTHERAPY    . DILATION AND CURETTAGE OF UTERUS  2005  . TUBAL LIGATION     Allergies  Allergen Reactions  . Celexa [Citalopram Hydrobromide] Nausea Only   No current facility-administered medications on file prior to encounter.   Current Outpatient Medications on File Prior to Encounter  Medication Sig Dispense Refill  . triamcinolone cream (KENALOG) 0.5 % Apply 1 application topically 3 (three) times daily. 30 g 1   Social History   Socioeconomic History  . Marital status: Married    Spouse name: Not on file  . Number of children: Not on file  . Years of education: Not on file  . Highest education level: Not on file  Occupational History  . Not on file  Tobacco Use  . Smoking status: Never Smoker  . Smokeless tobacco: Never Used  Vaping Use  . Vaping Use: Never used  Substance and Sexual Activity  . Alcohol use: No  . Drug use: No  . Sexual activity: Yes    Birth control/protection: Condom, Surgical    Comment: Tubal Ligation  Other Topics Concern  . Not on file  Social History Narrative  . Not on file   Social Determinants of Health     Financial Resource Strain:   . Difficulty of Paying Living Expenses:   Food Insecurity:   . Worried About Programme researcher, broadcasting/film/video in the Last Year:   . Barista in the Last Year:   Transportation Needs:   . Freight forwarder (Medical):   Marland Kitchen Lack of Transportation (Non-Medical):   Physical Activity:   . Days of Exercise per Week:   . Minutes of Exercise per Session:   Stress:   . Feeling of Stress :   Social Connections:   . Frequency of Communication with Friends and Family:   . Frequency of Social Gatherings with Friends and Family:   . Attends Religious Services:   . Active Member of Clubs or Organizations:   . Attends Banker Meetings:   Marland Kitchen Marital Status:   Intimate Partner Violence:   . Fear of Current or Ex-Partner:   . Emotionally Abused:   Marland Kitchen Physically Abused:   . Sexually Abused:    Family History  Problem Relation Age of Onset  . Hypertension Mother   . Diabetes Paternal Grandmother   . Breast cancer Paternal Grandmother        later in life  . Diabetes Paternal Grandfather   . Heart disease Father     OBJECTIVE:  Vitals:   08/06/19 1258  BP: 128/84  Pulse: 78  Resp: 17  Temp: 98.2 F (36.8 C)  TempSrc: Oral  SpO2: 99%    General appearance: alert; appears fatigued, but nontoxic; speaking in full sentences and tolerating own secretions HEENT: NCAT; Ears: EACs erythematous, TMs with purulent drainage; Eyes: PERRL.  EOM grossly intact. Nose: nares patent without rhinorrhea, Throat: oropharynx clear, tonsils non erythematous or enlarged, uvula midline  Neck: supple without LAD Lungs: unlabored respirations, symmetrical air entry; cough: absent; no respiratory distress; CTAB Heart: regular rate and rhythm.   Skin: warm and dry Psychological: alert and cooperative; normal mood and affect  ASSESSMENT & PLAN:  1. Sinus congestion   2. Otalgia, unspecified laterality     Meds ordered this encounter  Medications  . cetirizine  (ZYRTEC) 10 MG tablet    Sig: Take 1 tablet (10 mg total) by mouth daily.    Dispense:  30 tablet    Refill:  0    Order Specific Question:   Supervising Provider    Answer:   Raylene Everts [[id number]]  . fluticasone (FLONASE) 50 MCG/ACT nasal spray    Sig: Place 2 sprays into both nostrils daily.    Dispense:  16 g    Refill:  0    Order Specific Question:   Supervising Provider    Answer:   Raylene Everts [[id number]]  . amoxicillin-clavulanate (AUGMENTIN) 875-125 MG tablet    Sig: Take 1 tablet by mouth every 12 (twelve) hours for 10 days.    Dispense:  20 tablet    Refill:  0    Order Specific Question:   Supervising Provider    Answer:   Raylene Everts [[id number]]    Get plenty of rest and push fluids Augmentin paper prescription given.  Will fill for bilateral ear infection zyrtec for nasal congestion, runny nose, and/or sore throat flonase for nasal congestion and runny nose Use medications daily for symptom relief Use OTC medications like ibuprofen or tylenol as needed fever or pain Call or go to the ED if you have any new or worsening symptoms such as fever, cough, shortness of breath, chest tightness, chest pain, turning blue, changes in mental status, etc...   Reviewed expectations re: course of current medical issues. Questions answered. Outlined signs and symptoms indicating need for more acute intervention. Patient verbalized understanding. After Visit Summary given.         Lestine Box, PA-C 08/06/19 1334

## 2019-08-06 NOTE — Discharge Instructions (Addendum)
Get plenty of rest and push fluids Augmentin paper prescription given.  Fill in 2-3 days if symptoms do not improve zyrtec for nasal congestion, runny nose, and/or sore throat flonase for nasal congestion and runny nose Use medications daily for symptom relief Use OTC medications like ibuprofen or tylenol as needed fever or pain Call or go to the ED if you have any new or worsening symptoms such as fever, cough, shortness of breath, chest tightness, chest pain, turning blue, changes in mental status, etc..Marland Kitchen

## 2019-10-11 ENCOUNTER — Telehealth (INDEPENDENT_AMBULATORY_CARE_PROVIDER_SITE_OTHER): Payer: BC Managed Care – PPO | Admitting: Family Medicine

## 2019-10-11 ENCOUNTER — Other Ambulatory Visit: Payer: Self-pay

## 2019-10-11 DIAGNOSIS — M791 Myalgia, unspecified site: Secondary | ICD-10-CM | POA: Diagnosis not present

## 2019-10-11 NOTE — Progress Notes (Signed)
   Subjective:    Patient ID: Kristin Buckley, female    DOB: [birth year], 39 y.o.   MRN: [id number] Very nice mom She is wondering about vaccines for her teenage children She is concerned about the potential for side effects but also does not want her kids to get Covid She was concerned about the possibility of sterility She wondered what our opinion was regarding this  HPI Patient calling in today with questions regarding the covid vaccine for her children. Patient had first dose of Moderna this month.  Virtual Visit via Video Note  I connected with Kristin Buckley on 10/11/19 at 11:00 AM EDT by a video enabled telemedicine application and verified that I am speaking with the correct person using two identifiers.  Location: Patient: home Provider: office    I discussed the limitations of evaluation and management by telemedicine and the availability of in person appointments. The patient expressed understanding and agreed to proceed.  History of Present Illness:    Observations/Objective:   Assessment and Plan:   Follow Up Instructions:    I discussed the assessment and treatment plan with the patient. The patient was provided an opportunity to ask questions and all were answered. The patient agreed with the plan and demonstrated an understanding of the instructions.   The patient was advised to call back or seek an in-person evaluation if the symptoms worsen or if the condition fails to improve as anticipated.  I provided 20 minutes of non-face-to-face time during this encounter.  Including documentation   Review of Systems Please see above currently not having any health issues no fevers chills sweats    Objective:   Physical Exam Patient had virtual visit Appears to be in no distress Atraumatic Neuro able to relate and oriented No apparent resp distress Color normal        Assessment & Plan:  Patient did have myalgias with her Covid vaccine but this is to  be expected more than likely she will have similar symptoms with her second vaccine possibly harsher since she did in fact have Covid several months ago  As for her 2 teenagers I would recommend the vaccine but I cannot give 100% guarantee that there is no long-term risk.  Based upon current science there is no obvious long-term or ask.  There is a short term risk of myocarditis that could last 1 to 3 days but typically once identified treated with ibuprofen and rest goes away.  Very unlikely to have severe fatal allergy to this vaccine.  By getting the vaccine it can lessen the risk of long-term effects of Covid.  Mom will think about it and then decide.  I have not heard of any evidence that points toward sterility with this vaccine

## 2019-11-02 DIAGNOSIS — Z2009 Contact with and (suspected) exposure to other intestinal infectious diseases: Secondary | ICD-10-CM | POA: Diagnosis not present

## 2019-11-11 ENCOUNTER — Other Ambulatory Visit: Payer: BC Managed Care – PPO

## 2019-11-11 DIAGNOSIS — Z20822 Contact with and (suspected) exposure to covid-19: Secondary | ICD-10-CM

## 2019-11-12 LAB — SARS-COV-2, NAA 2 DAY TAT

## 2019-11-12 LAB — NOVEL CORONAVIRUS, NAA: SARS-CoV-2, NAA: NOT DETECTED

## 2019-12-20 DIAGNOSIS — Z2009 Contact with and (suspected) exposure to other intestinal infectious diseases: Secondary | ICD-10-CM | POA: Diagnosis not present

## 2020-01-03 ENCOUNTER — Ambulatory Visit: Payer: BC Managed Care – PPO | Admitting: Obstetrics & Gynecology

## 2020-02-14 ENCOUNTER — Telehealth: Payer: Self-pay | Admitting: Obstetrics & Gynecology

## 2020-02-14 ENCOUNTER — Other Ambulatory Visit: Payer: Self-pay | Admitting: Obstetrics & Gynecology

## 2020-02-14 MED ORDER — FLUCONAZOLE 150 MG PO TABS: 150.0000 mg | ORAL_TABLET | Freq: Once | ORAL | 0 refills | Status: AC

## 2020-02-14 NOTE — Telephone Encounter (Signed)
Called pt after mychart message sent regarding symptoms of possible vaginitis/UTI.  Has used Diflucan in the past when this happened and it clear up symptoms. Requests rx which was sent to pharmacy.  Pt aware to let me know if symptoms do not resolve.

## 2020-03-06 ENCOUNTER — Ambulatory Visit (INDEPENDENT_AMBULATORY_CARE_PROVIDER_SITE_OTHER): Payer: BC Managed Care – PPO | Admitting: Obstetrics & Gynecology

## 2020-03-06 ENCOUNTER — Encounter: Payer: Self-pay | Admitting: Obstetrics & Gynecology

## 2020-03-06 ENCOUNTER — Other Ambulatory Visit: Payer: Self-pay

## 2020-03-06 VITALS — BP 139/87 | HR 70 | Ht 71.0 in | Wt 241.8 lb

## 2020-03-06 DIAGNOSIS — I83893 Varicose veins of bilateral lower extremities with other complications: Secondary | ICD-10-CM

## 2020-03-06 DIAGNOSIS — F419 Anxiety disorder, unspecified: Secondary | ICD-10-CM | POA: Diagnosis not present

## 2020-03-06 DIAGNOSIS — Z1231 Encounter for screening mammogram for malignant neoplasm of breast: Secondary | ICD-10-CM

## 2020-03-06 DIAGNOSIS — E7800 Pure hypercholesterolemia, unspecified: Secondary | ICD-10-CM

## 2020-03-06 DIAGNOSIS — Z01419 Encounter for gynecological examination (general) (routine) without abnormal findings: Secondary | ICD-10-CM

## 2020-03-06 DIAGNOSIS — R238 Other skin changes: Secondary | ICD-10-CM

## 2020-03-06 MED ORDER — TERCONAZOLE 0.4 % VA CREA: TOPICAL_CREAM | VAGINAL | 1 refills | Status: DC

## 2020-03-06 MED ORDER — HYDROXYZINE HCL 10 MG PO TABS: 10.0000 mg | ORAL_TABLET | Freq: Four times a day (QID) | ORAL | 0 refills | Status: DC | PRN

## 2020-03-06 NOTE — Progress Notes (Signed)
40 y.o. [id number] Married White or Caucasian female here for annual exam.  Reports she is still having some anxiety.  When she focuses on it, her heart rate will increase.  She can do some mindfulness techniques and this helps.  She is in the process of changing jobs.  She has tried prozac and cymbalta in the past.  Had side effects and just isn't going to take one of these types of medications.    Patient's last menstrual period was 02/03/2020 (approximate).          Sexually active: Yes.    The current method of family planning is tubal ligation.    Smoker:  yes  Health Maintenance: Pap:  12/2018 History of abnormal Pap:  Yes, cryo in 2011 MMG:  Discussed guidelines Colonoscopy:  Guidelines reviewed BMD:  n/a TDaP:  Up to date through occupational health Pneumonia vaccine(s):  n/a Shingrix:   n/a Hep C testing: work exposure Screening Labs: lipids will be done   reports that she has never smoked. She has never used smokeless tobacco. She reports that she does not drink alcohol and does not use drugs.  Past Medical History:  Diagnosis Date  . Abnormal Pap smear   . Anxiety   . Hx of urinary tract infection   . Irregular menses 05/04/2012  . Ovarian cyst 2012   right  . Superficial thrombosis of leg     Past Surgical History:  Procedure Laterality Date  . CESAREAN SECTION    . CRYOTHERAPY    . DILATION AND CURETTAGE OF UTERUS  2005  . TUBAL LIGATION      Current Outpatient Medications  Medication Sig Dispense Refill  . cetirizine (ZYRTEC) 10 MG tablet Take 1 tablet (10 mg total) by mouth daily. (Patient not taking: Reported on 10/11/2019) 30 tablet 0  . fluticasone (FLONASE) 50 MCG/ACT nasal spray Place 2 sprays into both nostrils daily. (Patient not taking: Reported on 10/11/2019) 16 g 0  . triamcinolone cream (KENALOG) 0.5 % Apply 1 application topically 3 (three) times daily. (Patient not taking: Reported on 03/06/2020) 30 g 1   No current facility-administered medications  for this visit.    Family History  Problem Relation Age of Onset  . Hypertension Mother   . Diabetes Paternal Grandmother   . Breast cancer Paternal Grandmother        later in life  . Diabetes Paternal Grandfather   . Heart disease Father     Review of Systems  All other systems reviewed and are negative.   Exam:   BP 139/87   Pulse 70   Ht 5\' 11"  (1.803 m)   Wt 241 lb 12.8 oz (109.7 kg)   LMP 02/03/2020 (Approximate)   BMI 33.72 kg/m   Height: 5\' 11"  (180.3 cm)  General appearance: alert, cooperative and appears stated age Head: Normocephalic, without obvious abnormality, atraumatic Neck: no adenopathy, supple, symmetrical, trachea midline and thyroid normal to inspection and palpation Lungs: clear to auscultation bilaterally Breasts: normal appearance, no masses or tenderness Heart: regular rate and rhythm Abdomen: soft, non-tender; bowel sounds normal; no masses,  no organomegaly Extremities: extremities normal, atraumatic, no cyanosis or edema Skin: Skin color, texture, turgor normal. No rashes or lesions Lymph nodes: Cervical, supraclavicular, and axillary nodes normal. No abnormal inguinal nodes palpated Neurologic: Grossly normal   Pelvic: External genitalia:  no lesions              Urethra:  normal appearing urethra with no masses, tenderness  or lesions              Bartholins and Skenes: normal                 Vagina: normal appearing vagina with normal color and discharge, no lesions              Cervix: no lesions              Pap taken: No. Bimanual Exam:  Uterus:  normal size, contour, position, consistency, mobility, non-tender              Adnexa: normal adnexa and no mass, fullness, tenderness               Rectovaginal: not performed  Chaperone, Christine, was present for exam.  Assessment/Plan: 1. Well woman exam with routine gynecological exam - Pap with neg HR HPV 12/28/2018.  Repeat 3 years due to h/o cryo - screening mammogram  recommended.  Order placed for after her birthday. - screening colonoscopy guidelines reviewed - lab work reviewed - vaccines updates  2. Anxiety - hydrOXYzine (ATARAX/VISTARIL) 10 MG tablet; Take 1 tablet (10 mg total) by mouth every 6 (six) hours as needed for anxiety.  Dispense: 30 tablet; Refill: 0  3. Elevated LDL cholesterol level - Lipid panel  4. Skin irritation - terconazole (TERAZOL 7) 0.4 % vaginal cream; Apply topically twice daily for up to 5 days  Dispense: 45 g; Refill: 1  5. Varicose veins of bilateral lower extremities with other complications - information about treatment discussed.  Providers and typical work up discussed.  Pt will let me know if needs/wants referral.

## 2020-03-06 NOTE — Patient Instructions (Signed)
[facility] [address] [place],  [place]  [address]  Main: [phone] 

## 2020-03-07 LAB — LIPID PANEL
Chol/HDL Ratio: 3.4 ratio (ref 0.0–4.4)
Cholesterol, Total: 210 mg/dL — ABNORMAL HIGH (ref 100–199)
HDL: 62 mg/dL (ref >39)
LDL Chol Calc (NIH): 130 mg/dL — ABNORMAL HIGH (ref 0–99)
Triglycerides: 103 mg/dL (ref 0–149)
VLDL Cholesterol Cal: 18 mg/dL (ref 5–40)

## 2020-04-01 ENCOUNTER — Encounter: Payer: Self-pay | Admitting: Emergency Medicine

## 2020-04-01 ENCOUNTER — Ambulatory Visit
Admission: EM | Admit: 2020-04-01 | Discharge: 2020-04-01 | Disposition: A | Discharge status: Home / Self Care | Payer: BC Managed Care – PPO | Attending: Family Medicine | Admitting: Family Medicine

## 2020-04-01 ENCOUNTER — Other Ambulatory Visit: Payer: Self-pay

## 2020-04-01 DIAGNOSIS — J0140 Acute pansinusitis, unspecified: Secondary | ICD-10-CM | POA: Diagnosis not present

## 2020-04-01 MED ORDER — AMOXICILLIN-POT CLAVULANATE 875-125 MG PO TABS: 1.0000 | ORAL_TABLET | Freq: Two times a day (BID) | ORAL | 0 refills | Status: AC

## 2020-04-01 NOTE — ED Provider Notes (Signed)
[facility]   [id number] 04/01/20 Arrival Time: 1127  GB:TDVV THROAT  SUBJECTIVE: History from: patient.  HALEI HANOVER is a 40 y.o. female who presents with abrupt onset of nasal congestion, headache, fatigue for the last week and half. Reports that she gets Covid testing weekly at work. Has had consistently negative tests. Denies sick exposure to Covid, strep, flu or mono, or precipitating event. Has negative history of Covid. Has had Covid vaccines. Has tried Mucinex without relief. There are no aggravating symptoms. Denies previous symptoms in the past.     Denies fever, chills, ear pain, cough, SOB, wheezing, chest pain, nausea, rash, changes in bowel or bladder habits.    ROS: As per HPI.  All other pertinent ROS negative.     Past Medical History:  Diagnosis Date  . Abnormal Pap smear   . Anxiety   . Hx of urinary tract infection   . Irregular menses 05/04/2012  . Ovarian cyst 2012   right  . Superficial thrombosis of leg    Past Surgical History:  Procedure Laterality Date  . CESAREAN SECTION    . CRYOTHERAPY    . DILATION AND CURETTAGE OF UTERUS  2005  . TUBAL LIGATION     Allergies  Allergen Reactions  . Celexa [Citalopram Hydrobromide] Nausea Only   No current facility-administered medications on file prior to encounter.   Current Outpatient Medications on File Prior to Encounter  Medication Sig Dispense Refill  . hydrOXYzine (ATARAX/VISTARIL) 10 MG tablet Take 1 tablet (10 mg total) by mouth every 6 (six) hours as needed for anxiety. 30 tablet 0  . terconazole (TERAZOL 7) 0.4 % vaginal cream Apply topically twice daily for up to 5 days 45 g 1   Social History   Socioeconomic History  . Marital status: Married    Spouse name: Not on file  . Number of children: Not on file  . Years of education: Not on file  . Highest education level: Not on file  Occupational History  . Not on file  Tobacco Use  . Smoking status: Never Smoker  . Smokeless  tobacco: Never Used  Vaping Use  . Vaping Use: Never used  Substance and Sexual Activity  . Alcohol use: No  . Drug use: No  . Sexual activity: Yes    Birth control/protection: Condom, Surgical    Comment: Tubal Ligation  Other Topics Concern  . Not on file  Social History Narrative  . Not on file   Social Determinants of Health   Financial Resource Strain: Not on file  Food Insecurity: Not on file  Transportation Needs: Not on file  Physical Activity: Not on file  Stress: Not on file  Social Connections: Not on file  Intimate Partner Violence: Not on file   Family History  Problem Relation Age of Onset  . Hypertension Mother   . Diabetes Paternal Grandmother   . Breast cancer Paternal Grandmother        later in life  . Diabetes Paternal Grandfather   . Heart disease Father     OBJECTIVE:  Vitals:   04/01/20 1222  BP: (!) 143/87  Pulse: 78  Resp: 18  Temp: 98.1 F (36.7 C)  TempSrc: Oral  SpO2: 98%     General appearance: alert; appears fatigued, but nontoxic, speaking in full sentences and managing own secretions HEENT: NCAT; Ears: EACs clear, TMs pearly gray with visible cone of light, without erythema; Eyes: PERRL, EOMI grossly; Nose: no obvious rhinorrhea;  Throat: oropharynx erythematous, cobblestoning present, tonsils 1+ and mildly erythematous without white tonsillar exudates, uvula midline; Sinuses: frontal and maxillary sinuses tender to palpation Neck: supple without LAD Lungs: CTA bilaterally without adventitious breath sounds; cough absent Heart: regular rate and rhythm.  Radial pulses 2+ symmetrical bilaterally Skin: warm and dry Psychological: alert and cooperative; normal mood and affect  LABS: No results found for this or any previous visit (from the past 24 hour(s)).   ASSESSMENT & PLAN:  1. Acute non-recurrent pansinusitis     Meds ordered this encounter  Medications  . amoxicillin-clavulanate (AUGMENTIN) 875-125 MG tablet    Sig:  Take 1 tablet by mouth 2 (two) times daily for 7 days.    Dispense:  14 tablet    Refill:  0    Order Specific Question:   Supervising Provider    Answer:   Merrilee Jansky [id number]    Acute Sinusitis Push fluids and get rest Prescribed Augmentin 875 BID x 7 days Take as directed and to completion.  Drink warm or cool liquids, use throat lozenges, or popsicles to help alleviate symptoms Take OTC ibuprofen or tylenol as needed for pain May use Zyrtec D and flonase to help alleviate symptoms Follow up with PCP if symptoms persist Return or go to ER if you have any new or worsening symptoms such as fever, chills, nausea, vomiting, worsening sore throat, cough, abdominal pain, chest pain, changes in bowel or bladder habits.   Reviewed expectations re: course of current medical issues. Questions answered. Outlined signs and symptoms indicating need for more acute intervention. Patient verbalized understanding. After Visit Summary given.          Moshe Cipro, NP 04/01/20 1512

## 2020-04-01 NOTE — ED Triage Notes (Signed)
Sinus infection for 1 week and facial pain, green congestion.

## 2020-04-01 NOTE — Discharge Instructions (Addendum)
I have sent in Augmentin for you to take twice a day for 7 days.  Follow up with this office or with primary care if symptoms are persisting.  Follow up in the ER for high fever, trouble swallowing, trouble breathing, other concerning symptoms.  

## 2020-04-21 ENCOUNTER — Other Ambulatory Visit: Payer: Self-pay

## 2020-04-21 DIAGNOSIS — I8391 Asymptomatic varicose veins of right lower extremity: Secondary | ICD-10-CM

## 2020-04-26 ENCOUNTER — Ambulatory Visit (HOSPITAL_COMMUNITY)
Admission: RE | Admit: 2020-04-26 | Discharge: 2020-04-26 | Disposition: A | Discharge status: Home / Self Care | Payer: BC Managed Care – PPO | Source: Ambulatory Visit | Attending: Vascular Surgery | Admitting: Vascular Surgery

## 2020-04-26 ENCOUNTER — Other Ambulatory Visit: Payer: Self-pay

## 2020-04-26 ENCOUNTER — Ambulatory Visit: Payer: BC Managed Care – PPO | Admitting: Physician Assistant

## 2020-04-26 VITALS — BP 118/84 | HR 67 | Temp 98.6°F | Resp 20 | Ht 71.0 in | Wt 237.2 lb

## 2020-04-26 DIAGNOSIS — I872 Venous insufficiency (chronic) (peripheral): Secondary | ICD-10-CM | POA: Diagnosis not present

## 2020-04-26 DIAGNOSIS — I8391 Asymptomatic varicose veins of right lower extremity: Secondary | ICD-10-CM | POA: Diagnosis not present

## 2020-04-26 NOTE — Progress Notes (Signed)
[facility] [place]   Reason for referral: Swollen veins on the right LE  History of Present Illness  Kristin Buckley is a 40 y.o. female who presents with chief complaint: swollen leg.  Patient notes, onset of swelling >4 years ago, associated with prolonged standing and sitting since she gave birth to her child.   The patient has had no history of DVT, She thinks she had an episode of superficial thrombophlebitis in the past.  Positive right LE history of varicose vein, no history of venous stasis ulcers, no history of  Lymphedema and no history of skin changes in lower legs.  There is no family history of venous disorders.  The patient has  used compression stockings in the past.  Past Medical History:  Diagnosis Date  . Abnormal Pap smear   . Anxiety   . Hx of urinary tract infection   . Irregular menses 05/04/2012  . Ovarian cyst 2012   right  . Superficial thrombosis of leg     Past Surgical History:  Procedure Laterality Date  . CESAREAN SECTION    . CRYOTHERAPY    . DILATION AND CURETTAGE OF UTERUS  2005  . TUBAL LIGATION      Social History   Socioeconomic History  . Marital status: Married    Spouse name: Not on file  . Number of children: 3  . Years of education: Not on file  . Highest education level: Not on file  Occupational History  . Not on file  Tobacco Use  . Smoking status: Never Smoker  . Smokeless tobacco: Never Used  Vaping Use  . Vaping Use: Never used  Substance and Sexual Activity  . Alcohol use: No  . Drug use: No  . Sexual activity: Yes    Birth control/protection: Condom, Surgical    Comment: Tubal Ligation  Other Topics Concern  . Not on file  Social History Narrative  . Not on file   Social Determinants of Health   Financial Resource Strain: Not on file  Food Insecurity: Not on file  Transportation Needs: Not on file  Physical Activity: Not on file  Stress: Not on file  Social Connections: Not on file   Intimate Partner Violence: Not on file    Family History  Problem Relation Age of Onset  . Hypertension Mother   . Diabetes Paternal Grandmother   . Breast cancer Paternal Grandmother        later in life  . Diabetes Paternal Grandfather   . Heart disease Father     Current Outpatient Medications on File Prior to Visit  Medication Sig Dispense Refill  . hydrOXYzine (ATARAX/VISTARIL) 10 MG tablet Take 1 tablet (10 mg total) by mouth every 6 (six) hours as needed for anxiety. (Patient not taking: Reported on 04/26/2020) 30 tablet 0  . terconazole (TERAZOL 7) 0.4 % vaginal cream Apply topically twice daily for up to 5 days (Patient not taking: Reported on 04/26/2020) 45 g 1   No current facility-administered medications on file prior to visit.    Allergies as of 04/26/2020 - Review Complete 04/26/2020  Allergen Reaction Noted  . Celexa [citalopram hydrobromide] Nausea Only 01/09/2013     ROS:   General:  No weight loss, Fever, chills  HEENT: No recent headaches, no nasal bleeding, no visual changes, no sore throat  Neurologic: No dizziness, blackouts, seizures. No recent symptoms of stroke or mini- stroke. No recent episodes of slurred speech, or temporary blindness.  Cardiac: No recent episodes of chest pain/pressure, no shortness of breath at rest.  No shortness of breath with exertion.  Denies history of atrial fibrillation or irregular heartbeat  Vascular: No history of rest pain in feet.  No history of claudication.  No history of non-healing ulcer, No history of DVT   Pulmonary: No home oxygen, no productive cough, no hemoptysis,  No asthma or wheezing  Musculoskeletal:  [ ]  Arthritis, [ ]  Low back pain,  [ ]  Joint pain  Hematologic:No history of hypercoagulable state.  No history of easy bleeding.  No history of anemia  Gastrointestinal: No hematochezia or melena,  No gastroesophageal reflux, no trouble swallowing  Urinary: [ ]  chronic Kidney disease, [ ]  on HD - [  ] MWF or [ ]  TTHS, [ ]  Burning with urination, [ ]  Frequent urination, [ ]  Difficulty urinating;   Skin: No rashes  Psychological: No history of anxiety,  No history of depression  Physical Examination  Vitals:   04/26/20 1354  BP: 118/84  Pulse: 67  Resp: 20  Temp: 98.6 F (37 C)  TempSrc: Temporal  SpO2: 100%  Weight: 237 lb 3.2 oz (107.6 kg)  Height: 5\' 11"  (1.803 m)    Body mass index is 33.08 kg/m.  General:  Alert and oriented, no acute distress HEENT: Normal Neck: No bruit or JVD Pulmonary: Clear to auscultation bilaterally Cardiac: Regular Rate and Rhythm without murmur Abdomen: Soft, non-tender, non-distended, no mass, no scars Skin: No rash      Extremity Pulses:  2+ radial, brachial, femoral, dorsalis pedis pulses bilaterally Musculoskeletal: No deformity or edema  Neurologic: Upper and lower extremity motor 5/5 and symmetric  DATA:   Venous Reflux Times  +--------------+---------+------+-----------+------------+-----------------  ----+  RIGHT     Reflux NoRefluxReflux TimeDiameter cmsComments                      Yes                          +--------------+---------+------+-----------+------------+-----------------  ----+  CFV      no                                +--------------+---------+------+-----------+------------+-----------------  ----+  FV mid    no                                +--------------+---------+------+-----------+------------+-----------------  ----+  Popliteal   no                                +--------------+---------+------+-----------+------------+-----------------  ----+  GSV at [facility]        yes  >500 ms   0.8                +--------------+---------+------+-----------+------------+-----------------  ----+   GSV prox thigh      yes  >500 ms   0.57               +--------------+---------+------+-----------+------------+-----------------  ----+  GSV mid thigh no               0.47               +--------------+---------+------+-----------+------------+-----------------  ----+  GSV dist thigh      yes  >500 ms  0.32               +--------------+---------+------+-----------+------------+-----------------  ----+  GSV at knee        yes  >500 ms   0.43               +--------------+---------+------+-----------+------------+-----------------  ----+  GSV prox calf       yes  >500 ms   0.33               +--------------+---------+------+-----------+------------+-----------------  ----+  SSV Pop Fossa       yes  >500 ms   0.33  No SPJ, SSV runs  up                              proximal thigh      +--------------+---------+------+-----------+------------+-----------------  ----+  SSV prox calf                 0.27               +--------------+---------+------+-----------+------------+-----------------  ----+  SSV mid calf                 0.24               +--------------+---------+------+-----------+------------+-----------------  ----+   Summary:  Right:  - No evidence of deep vein thrombosis seen in the right lower extremity,  from the common femoral through the popliteal veins.  - No evidence of superficial venous thrombosis in the right lower  extremity.  - Venous reflux is noted in the right sapheno-femoral junction.  - Venous reflux is noted in the right greater saphenous vein in the thigh.  - Venous reflux is noted in the right greater saphenous vein in the calf.  - Venous reflux is noted in the right  short saphenous vein origin.    Assessment: Varicose veins with symptoms of heaviness and pain at times. The venous reflux study show SFJ reflux and GSV reflux with the vein size > 0.4 in more than one area.  She has palpable pedal pulses and is not at risk of limb loss.   Plan: She was fitted with thigh high compression 20-30 mmhg to be worn daily for support, exercise daily, elevation when at rest.  She will f/u in 3 months to determine if she is a candidate for laser ablation therapy.  I also ordered a venous reflux study on the left LE at her next f/u.   Mosetta Pigeon PA-C [facility] [place] Office: [phone]  MD in clinic [place]

## 2020-04-27 ENCOUNTER — Other Ambulatory Visit: Payer: Self-pay

## 2020-04-27 DIAGNOSIS — I872 Venous insufficiency (chronic) (peripheral): Secondary | ICD-10-CM

## 2020-07-13 ENCOUNTER — Ambulatory Visit: Payer: BC Managed Care – PPO | Admitting: Vascular Surgery

## 2020-07-13 ENCOUNTER — Ambulatory Visit (HOSPITAL_COMMUNITY): Payer: BC Managed Care – PPO | Attending: Vascular Surgery

## 2020-07-17 ENCOUNTER — Other Ambulatory Visit (HOSPITAL_BASED_OUTPATIENT_CLINIC_OR_DEPARTMENT_OTHER): Payer: Self-pay | Admitting: Obstetrics & Gynecology

## 2020-07-17 MED ORDER — FLUCONAZOLE 150 MG PO TABS: 150.0000 mg | ORAL_TABLET | Freq: Once | ORAL | 0 refills | Status: AC

## 2020-07-21 ENCOUNTER — Encounter: Payer: Self-pay | Admitting: Vascular Surgery

## 2020-11-20 ENCOUNTER — Other Ambulatory Visit: Payer: Self-pay | Admitting: Obstetrics & Gynecology

## 2020-11-20 DIAGNOSIS — Z1231 Encounter for screening mammogram for malignant neoplasm of breast: Secondary | ICD-10-CM

## 2020-11-21 ENCOUNTER — Other Ambulatory Visit: Payer: Self-pay

## 2020-11-21 ENCOUNTER — Ambulatory Visit
Admission: RE | Admit: 2020-11-21 | Discharge: 2020-11-21 | Disposition: A | Discharge status: Home / Self Care | Payer: BC Managed Care – PPO | Source: Ambulatory Visit | Attending: Obstetrics & Gynecology | Admitting: Obstetrics & Gynecology

## 2020-11-21 ENCOUNTER — Ambulatory Visit: Payer: BC Managed Care – PPO

## 2020-11-21 DIAGNOSIS — Z1231 Encounter for screening mammogram for malignant neoplasm of breast: Secondary | ICD-10-CM | POA: Diagnosis not present

## 2020-11-23 ENCOUNTER — Encounter (HOSPITAL_BASED_OUTPATIENT_CLINIC_OR_DEPARTMENT_OTHER): Payer: Self-pay

## 2020-12-31 ENCOUNTER — Encounter (HOSPITAL_BASED_OUTPATIENT_CLINIC_OR_DEPARTMENT_OTHER): Payer: Self-pay | Admitting: Obstetrics & Gynecology

## 2021-01-19 ENCOUNTER — Ambulatory Visit: Payer: Self-pay

## 2021-01-19 ENCOUNTER — Telehealth: Payer: BC Managed Care – PPO | Admitting: Physician Assistant

## 2021-01-19 DIAGNOSIS — B9689 Other specified bacterial agents as the cause of diseases classified elsewhere: Secondary | ICD-10-CM | POA: Diagnosis not present

## 2021-01-19 DIAGNOSIS — J019 Acute sinusitis, unspecified: Secondary | ICD-10-CM | POA: Diagnosis not present

## 2021-01-19 MED ORDER — BENZONATATE 100 MG PO CAPS: 100.0000 mg | ORAL_CAPSULE | Freq: Three times a day (TID) | ORAL | 0 refills | Status: DC | PRN

## 2021-01-19 MED ORDER — AMOXICILLIN-POT CLAVULANATE 875-125 MG PO TABS: 1.0000 | ORAL_TABLET | Freq: Two times a day (BID) | ORAL | 0 refills | Status: DC

## 2021-01-19 NOTE — Patient Instructions (Signed)
Kristin Buckley, thank you for joining Margaretann Loveless, PA-C for today's virtual visit.  While this provider is not your primary care provider (PCP), if your PCP is located in our provider database this encounter information will be shared with them immediately following your visit.  Consent: (Patient) Kristin Buckley provided verbal consent for this virtual visit at the beginning of the encounter.  Current Medications:  Current Outpatient Medications:    amoxicillin-clavulanate (AUGMENTIN) 875-125 MG tablet, Take 1 tablet by mouth 2 (two) times daily., Disp: 20 tablet, Rfl: 0   benzonatate (TESSALON) 100 MG capsule, Take 1 capsule (100 mg total) by mouth 3 (three) times daily as needed., Disp: 30 capsule, Rfl: 0   hydrOXYzine (ATARAX/VISTARIL) 10 MG tablet, Take 1 tablet (10 mg total) by mouth every 6 (six) hours as needed for anxiety. (Patient not taking: Reported on 04/26/2020), Disp: 30 tablet, Rfl: 0   terconazole (TERAZOL 7) 0.4 % vaginal cream, Apply topically twice daily for up to 5 days (Patient not taking: Reported on 04/26/2020), Disp: 45 g, Rfl: 1   Medications ordered in this encounter:  Meds ordered this encounter  Medications   amoxicillin-clavulanate (AUGMENTIN) 875-125 MG tablet    Sig: Take 1 tablet by mouth 2 (two) times daily.    Dispense:  20 tablet    Refill:  0    Order Specific Question:   Supervising Provider    Answer:   MILLER, BRIAN [[id number]]   benzonatate (TESSALON) 100 MG capsule    Sig: Take 1 capsule (100 mg total) by mouth 3 (three) times daily as needed.    Dispense:  30 capsule    Refill:  0    Order Specific Question:   Supervising Provider    Answer:   Hyacinth Meeker, BRIAN [[id number]]     *If you need refills on other medications prior to your next appointment, please contact your pharmacy*  Follow-Up: Call back or seek an in-person evaluation if the symptoms worsen or if the condition fails to improve as anticipated.  Other Instructions Sinusitis,  Adult Sinusitis is inflammation of your sinuses. Sinuses are hollow spaces in the bones around your face. Your sinuses are located: Around your eyes. In the middle of your forehead. Behind your nose. In your cheekbones. Mucus normally drains out of your sinuses. When your nasal tissues become inflamed or swollen, mucus can become trapped or blocked. This allows bacteria, viruses, and fungi to grow, which leads to infection. Most infections of the sinuses are caused by a virus. Sinusitis can develop quickly. It can last for up to 4 weeks (acute) or for more than 12 weeks (chronic). Sinusitis often develops after a cold. What are the causes? This condition is caused by anything that creates swelling in the sinuses or stops mucus from draining. This includes: Allergies. Asthma. Infection from bacteria or viruses. Deformities or blockages in your nose or sinuses. Abnormal growths in the nose (nasal polyps). Pollutants, such as chemicals or irritants in the air. Infection from fungi (rare). What increases the risk? You are more likely to develop this condition if you: Have a weak body defense system (immune system). Do a lot of swimming or diving. Overuse nasal sprays. Smoke. What are the signs or symptoms? The main symptoms of this condition are pain and a feeling of pressure around the affected sinuses. Other symptoms include: Stuffy nose or congestion. Thick drainage from your nose. Swelling and warmth over the affected sinuses. Headache. Upper toothache. A cough that may get  worse at night. Extra mucus that collects in the throat or the back of the nose (postnasal drip). Decreased sense of smell and taste. Fatigue. A fever. Sore throat. Bad breath. How is this diagnosed? This condition is diagnosed based on: Your symptoms. Your medical history. A physical exam. Tests to find out if your condition is acute or chronic. This may include: Checking your nose for nasal  polyps. Viewing your sinuses using a device that has a light (endoscope). Testing for allergies or bacteria. Imaging tests, such as an MRI or CT scan. In rare cases, a bone biopsy may be done to rule out more serious types of fungal sinus disease. How is this treated? Treatment for sinusitis depends on the cause and whether your condition is chronic or acute. If caused by a virus, your symptoms should go away on their own within 10 days. You may be given medicines to relieve symptoms. They include: Medicines that shrink swollen nasal passages (topical intranasal decongestants). Medicines that treat allergies (antihistamines). A spray that eases inflammation of the nostrils (topical intranasal corticosteroids). Rinses that help get rid of thick mucus in your nose (nasal saline washes). If caused by bacteria, your health care provider may recommend waiting to see if your symptoms improve. Most bacterial infections will get better without antibiotic medicine. You may be given antibiotics if you have: A severe infection. A weak immune system. If caused by narrow nasal passages or nasal polyps, you may need to have surgery. Follow these instructions at home: Medicines Take, use, or apply over-the-counter and prescription medicines only as told by your health care provider. These may include nasal sprays. If you were prescribed an antibiotic medicine, take it as told by your health care provider. Do not stop taking the antibiotic even if you start to feel better. Hydrate and humidify  Drink enough fluid to keep your urine pale yellow. Staying hydrated will help to thin your mucus. Use a cool mist humidifier to keep the humidity level in your home above 50%. Inhale steam for 10-15 minutes, 3-4 times a day, or as told by your health care provider. You can do this in the bathroom while a hot shower is running. Limit your exposure to cool or dry air. Rest Rest as much as possible. Sleep with your  head raised (elevated). Make sure you get enough sleep each night. General instructions  Apply a warm, moist washcloth to your face 3-4 times a day or as told by your health care provider. This will help with discomfort. Wash your hands often with soap and water to reduce your exposure to germs. If soap and water are not available, use hand sanitizer. Do not smoke. Avoid being around people who are smoking (secondhand smoke). Keep all follow-up visits as told by your health care provider. This is important. Contact a health care provider if: You have a fever. Your symptoms get worse. Your symptoms do not improve within 10 days. Get help right away if: You have a severe headache. You have persistent vomiting. You have severe pain or swelling around your face or eyes. You have vision problems. You develop confusion. Your neck is stiff. You have trouble breathing. Summary Sinusitis is soreness and inflammation of your sinuses. Sinuses are hollow spaces in the bones around your face. This condition is caused by nasal tissues that become inflamed or swollen. The swelling traps or blocks the flow of mucus. This allows bacteria, viruses, and fungi to grow, which leads to infection. If you were  prescribed an antibiotic medicine, take it as told by your health care provider. Do not stop taking the antibiotic even if you start to feel better. Keep all follow-up visits as told by your health care provider. This is important. This information is not intended to replace advice given to you by your health care provider. Make sure you discuss any questions you have with your health care provider. Document Revised: 06/30/2017 Document Reviewed: 06/30/2017 Elsevier Patient Education  2022 [employer].    If you have been instructed to have an in-person evaluation today at a local Urgent Care facility, please use the link below. It will take you to a list of all of our available [facility], including address, phone number and hours of operation. Please do not delay care.  [facility]  If you or a family member do not have a primary care provider, use the link below to schedule a visit and establish care. When you choose a [facility] primary care physician or advanced practice provider, you gain a long-term partner in health. Find a Primary Care Provider  Learn more about [facility]'s in-office and virtual care options: [facility] - Get Care Now

## 2021-01-19 NOTE — Progress Notes (Signed)
Virtual Visit Consent   TAMYKA BEZIO, you are scheduled for a virtual visit with a [facility] provider today.     Just as with appointments in the office, your consent must be obtained to participate.  Your consent will be active for this visit and any virtual visit you may have with one of our providers in the next 365 days.     If you have a MyChart account, a copy of this consent can be sent to you electronically.  All virtual visits are billed to your insurance company just like a traditional visit in the office.    As this is a virtual visit, video technology does not allow for your provider to perform a traditional examination.  This may limit your provider's ability to fully assess your condition.  If your provider identifies any concerns that need to be evaluated in person or the need to arrange testing (such as labs, EKG, etc.), we will make arrangements to do so.     Although advances in technology are sophisticated, we cannot ensure that it will always work on either your end or our end.  If the connection with a video visit is poor, the visit may have to be switched to a telephone visit.  With either a video or telephone visit, we are not always able to ensure that we have a secure connection.     I need to obtain your verbal consent now.   Are you willing to proceed with your visit today?    OZA OBERLE has provided verbal consent on 01/19/2021 for a virtual visit (video or telephone).   Margaretann Loveless, PA-C   Date: 01/19/2021 8:54 AM   Virtual Visit via Video Note   I, Margaretann Loveless, connected with  Kristin Buckley  ([id number], 06/23/1980) on 01/19/21 at  9:15 AM EST by a video-enabled telemedicine application and verified that I am speaking with the correct person using two identifiers.  Location: Patient: Virtual Visit Location Patient: Mobile Provider: Virtual Visit Location Provider: Home Office   I discussed the limitations of evaluation and  management by telemedicine and the availability of in person appointments. The patient expressed understanding and agreed to proceed.    History of Present Illness: Kristin Buckley is a 40 y.o. who identifies as a female who was assigned female at birth, and is being seen today for possible sinus infection.  HPI: Sinusitis This is a new problem. The current episode started in the past 7 days. The maximum temperature recorded prior to her arrival was 100.4 - 100.9 F. The fever has been present for 1 to 2 days. The pain is moderate. Associated symptoms include chills, congestion, coughing, a hoarse voice and sinus pressure. Pertinent negatives include no headaches, sneezing or sore throat. (Body aches) Treatments tried: tylenol sinus, alka seltzer severe, tylenol sore throat. The treatment provided no relief.   Covid testing is negative.  Problems:  Patient Active Problem List   Diagnosis Date Noted   PMDD (premenstrual dysphoric disorder) 03/05/2019   Hx of abnormal Pap smear 05/04/2012   Hematuria 05/04/2012   Irregular menses 05/04/2012    Allergies:  Allergies  Allergen Reactions   Celexa [Citalopram Hydrobromide] Nausea Only   Medications:  Current Outpatient Medications:    amoxicillin-clavulanate (AUGMENTIN) 875-125 MG tablet, Take 1 tablet by mouth 2 (two) times daily., Disp: 20 tablet, Rfl: 0   benzonatate (TESSALON) 100 MG capsule, Take 1 capsule (100 mg total) by mouth 3 (  three) times daily as needed., Disp: 30 capsule, Rfl: 0   hydrOXYzine (ATARAX/VISTARIL) 10 MG tablet, Take 1 tablet (10 mg total) by mouth every 6 (six) hours as needed for anxiety. (Patient not taking: Reported on 04/26/2020), Disp: 30 tablet, Rfl: 0   terconazole (TERAZOL 7) 0.4 % vaginal cream, Apply topically twice daily for up to 5 days (Patient not taking: Reported on 04/26/2020), Disp: 45 g, Rfl: 1  Observations/Objective: Patient is well-developed, well-nourished in no acute distress.  Resting  comfortably  Head is normocephalic, atraumatic.  No labored breathing.  Speech is clear and coherent with logical content.  Patient is alert and oriented at baseline.  Lots of nasal congestion and hoarse voice heard  Assessment and Plan: 1. Acute bacterial sinusitis - amoxicillin-clavulanate (AUGMENTIN) 875-125 MG tablet; Take 1 tablet by mouth 2 (two) times daily.  Dispense: 20 tablet; Refill: 0 - benzonatate (TESSALON) 100 MG capsule; Take 1 capsule (100 mg total) by mouth 3 (three) times daily as needed.  Dispense: 30 capsule; Refill: 0  - Worsening symptoms that have not responded to OTC medications.  - Will give augmentin and tessalon  - Continue allergy medications.  - Stay well hydrated and get plenty of rest.  - Call or seek in person evaluation if no symptom improvement or if symptoms worsen.  Follow Up Instructions: I discussed the assessment and treatment plan with the patient. The patient was provided an opportunity to ask questions and all were answered. The patient agreed with the plan and demonstrated an understanding of the instructions.  A copy of instructions were sent to the patient via MyChart unless otherwise noted below.    The patient was advised to call back or seek an in-person evaluation if the symptoms worsen or if the condition fails to improve as anticipated.  Time:  I spent 10 minutes with the patient via telehealth technology discussing the above problems/concerns.    [name], PA-C

## 2021-02-07 DIAGNOSIS — Z713 Dietary counseling and surveillance: Secondary | ICD-10-CM | POA: Diagnosis not present

## 2021-03-26 ENCOUNTER — Encounter (HOSPITAL_BASED_OUTPATIENT_CLINIC_OR_DEPARTMENT_OTHER): Payer: Self-pay | Admitting: Obstetrics & Gynecology

## 2021-03-26 ENCOUNTER — Other Ambulatory Visit (HOSPITAL_COMMUNITY)
Admission: RE | Admit: 2021-03-26 | Discharge: 2021-03-26 | Disposition: A | Discharge status: Home / Self Care | Payer: BC Managed Care – PPO | Source: Ambulatory Visit | Attending: Obstetrics & Gynecology | Admitting: Obstetrics & Gynecology

## 2021-03-26 ENCOUNTER — Ambulatory Visit (INDEPENDENT_AMBULATORY_CARE_PROVIDER_SITE_OTHER): Payer: BC Managed Care – PPO | Admitting: Obstetrics & Gynecology

## 2021-03-26 ENCOUNTER — Other Ambulatory Visit: Payer: Self-pay

## 2021-03-26 VITALS — BP 122/85 | HR 63 | Ht 71.0 in | Wt 234.0 lb

## 2021-03-26 DIAGNOSIS — Z0000 Encounter for general adult medical examination without abnormal findings: Secondary | ICD-10-CM | POA: Diagnosis not present

## 2021-03-26 DIAGNOSIS — Z01419 Encounter for gynecological examination (general) (routine) without abnormal findings: Secondary | ICD-10-CM | POA: Diagnosis not present

## 2021-03-26 DIAGNOSIS — Z124 Encounter for screening for malignant neoplasm of cervix: Secondary | ICD-10-CM | POA: Insufficient documentation

## 2021-03-26 NOTE — Patient Instructions (Signed)
300mg  to 400mg  two to three times daily

## 2021-03-26 NOTE — Progress Notes (Signed)
41 y.o. [id number] Married White or Caucasian female here for annual exam.  Reports cycles are normal and last about 5-6 days.  The first two days are the heaviest and definitely heavier than before her tubal ligation.  She has to change super tampons about every two hours.  Does not feel she needs any treatment.  Does have anxiety.  We have discussed this in the past.  She is not interested in prescription therapy at this time.  OTC options discussed.  Sexually active: Yes.    Smoker:  no  Health Maintenance: Pap:  12/28/18 neg History of abnormal Pap:  has been "several years" MMG:  11/21/20 neg Colonoscopy:  guidelines reviewed Screening Labs: today   reports that she has never smoked. She has never used smokeless tobacco. She reports that she does not drink alcohol and does not use drugs.  Past Medical History:  Diagnosis Date   Abnormal Pap smear    Anxiety    Hx of urinary tract infection    Irregular menses 05/04/2012   Ovarian cyst 2012   right   Superficial thrombosis of leg     Past Surgical History:  Procedure Laterality Date   CESAREAN SECTION     x 3   CRYOTHERAPY     DILATION AND CURETTAGE OF UTERUS  2005   TUBAL LIGATION      No current outpatient medications on file.   No current facility-administered medications for this visit.    Family History  Problem Relation Age of Onset   Hypertension Mother    Diabetes Paternal Grandmother    Breast cancer Paternal Grandmother        later in life   Diabetes Paternal Grandfather    Heart disease Father     Review of Systems  All other systems reviewed and are negative.  Exam:   BP 122/85    Pulse 63    Ht 5\' 11"  (1.803 m)    Wt 234 lb (106.1 kg)    LMP 03/13/2021 (Approximate)    BMI 32.64 kg/m   Height: 5\' 11"  (180.3 cm)  General appearance: alert, cooperative and appears stated age Head: Normocephalic, without obvious abnormality, atraumatic Neck: no adenopathy, supple, symmetrical, trachea midline and  thyroid normal to inspection and palpation Lungs: clear to auscultation bilaterally Breasts: normal appearance, no masses or tenderness Heart: regular rate and rhythm Abdomen: soft, non-tender; bowel sounds normal; no masses,  no organomegaly Extremities: extremities normal, atraumatic, no cyanosis or edema Skin: Skin color, texture, turgor normal. No rashes or lesions Lymph nodes: Cervical, supraclavicular, and axillary nodes normal. No abnormal inguinal nodes palpated Neurologic: Grossly normal   Pelvic: External genitalia:  no lesions              Urethra:  normal appearing urethra with no masses, tenderness or lesions              Bartholins and Skenes: normal                 Vagina: normal appearing vagina with normal color and no discharge, no lesions              Cervix: no lesions              Pap taken: Yes.   Bimanual Exam:  Uterus:  normal size, contour, position, consistency, mobility, non-tender              Adnexa: normal adnexa and no mass, fullness, tenderness  Rectovaginal: Confirms               Anus:  normal sphincter tone, no lesions  Chaperone, Raechel Ache, RN, was present for exam.  Assessment/Plan: 1. Well woman exam with routine gynecological exam - pap and HR HPV obtained today - MMG 11/21/2020 - colonoscopy guidelines discussed - vaccines reviewed/updated - lab work as per below  2. Blood tests for routine general physical examination - CBC - Comprehensive metabolic panel - Hemoglobin A1c - Lipid panel - TSH  3. Cervical cancer screening - Cytology - PAP( [place])

## 2021-03-27 LAB — HEMOGLOBIN A1C
Est. average glucose Bld gHb Est-mCnc: 114 mg/dL
Hgb A1c MFr Bld: 5.6 % (ref 4.8–5.6)

## 2021-03-27 LAB — LIPID PANEL
Chol/HDL Ratio: 3.6 ratio (ref 0.0–4.4)
Cholesterol, Total: 185 mg/dL (ref 100–199)
HDL: 51 mg/dL
LDL Chol Calc (NIH): 114 mg/dL — ABNORMAL HIGH (ref 0–99)
Triglycerides: 109 mg/dL (ref 0–149)
VLDL Cholesterol Cal: 20 mg/dL (ref 5–40)

## 2021-03-27 LAB — COMPREHENSIVE METABOLIC PANEL
ALT: 17 IU/L (ref 0–32)
AST: 23 IU/L (ref 0–40)
Albumin/Globulin Ratio: 1.7 (ref 1.2–2.2)
Albumin: 4.6 g/dL (ref 3.8–4.8)
Alkaline Phosphatase: 55 IU/L (ref 44–121)
BUN/Creatinine Ratio: 17 (ref 9–23)
BUN: 11 mg/dL (ref 6–24)
Bilirubin Total: 0.5 mg/dL (ref 0.0–1.2)
CO2: 22 mmol/L (ref 20–29)
Calcium: 9.4 mg/dL (ref 8.7–10.2)
Chloride: 103 mmol/L (ref 96–106)
Creatinine, Ser: 0.65 mg/dL (ref 0.57–1.00)
Globulin, Total: 2.7 g/dL (ref 1.5–4.5)
Glucose: 81 mg/dL (ref 70–99)
Potassium: 4.4 mmol/L (ref 3.5–5.2)
Sodium: 139 mmol/L (ref 134–144)
Total Protein: 7.3 g/dL (ref 6.0–8.5)
eGFR: 114 mL/min/{1.73_m2} (ref >59)

## 2021-03-27 LAB — CBC
Hematocrit: 39.7 % (ref 34.0–46.6)
Hemoglobin: 13 g/dL (ref 11.1–15.9)
MCH: 27.9 pg (ref 26.6–33.0)
MCHC: 32.7 g/dL (ref 31.5–35.7)
MCV: 85 fL (ref 79–97)
Platelets: 269 10*3/uL (ref 150–450)
RBC: 4.66 x10E6/uL (ref 3.77–5.28)
RDW: 12.8 % (ref 11.7–15.4)
WBC: 8.8 10*3/uL (ref 3.4–10.8)

## 2021-03-27 LAB — TSH: TSH: 2.08 u[IU]/mL (ref 0.450–4.500)

## 2021-03-28 ENCOUNTER — Encounter (HOSPITAL_BASED_OUTPATIENT_CLINIC_OR_DEPARTMENT_OTHER): Payer: Self-pay | Admitting: Obstetrics & Gynecology

## 2021-03-28 LAB — CYTOLOGY - PAP
Comment: NEGATIVE
Diagnosis: NEGATIVE
High risk HPV: NEGATIVE

## 2021-04-05 ENCOUNTER — Ambulatory Visit: Payer: BC Managed Care – PPO | Admitting: Family Medicine

## 2021-04-05 ENCOUNTER — Other Ambulatory Visit: Payer: Self-pay

## 2021-04-05 ENCOUNTER — Encounter: Payer: Self-pay | Admitting: Family Medicine

## 2021-04-05 VITALS — BP 130/78 | Temp 98.1°F | Wt 234.8 lb

## 2021-04-05 DIAGNOSIS — R7309 Other abnormal glucose: Secondary | ICD-10-CM

## 2021-04-05 DIAGNOSIS — E669 Obesity, unspecified: Secondary | ICD-10-CM

## 2021-04-05 DIAGNOSIS — E7849 Other hyperlipidemia: Secondary | ICD-10-CM | POA: Diagnosis not present

## 2021-04-05 MED ORDER — PHENTERMINE HCL 37.5 MG PO TABS: 37.5000 mg | ORAL_TABLET | Freq: Every day | ORAL | 2 refills | Status: DC

## 2021-04-05 NOTE — Progress Notes (Signed)
° °  Subjective:    Patient ID: Kristin Buckley, female    DOB: 25-Jan-1981, 41 y.o.   MRN: [id number]  HPI Pt had physical through GYN office. GYN did panel of blood work. A1C was 5.6 and pt would like to discuss this with PCP. Family has history of DM.  Labs were reviewed Patient does have moderate weight related issues She states she is starting to try to do better with eating Exercises some weeks but other weeks is too busy to do so In addition to this she does have a family history of diabetes She is concerned about her A1c Concerned about her cholesterol   Review of Systems     Objective:   Physical Exam  20-minute discussion held today regarding her labs      Assessment & Plan:  Long discussion today discussing how GLP-1 medications work including side effects and risk profiles Also discussed how this medication is meant to be lifelong not just short-term In addition to this discuss healthy eating concentrating on vegetables and fruits minimize meats in addition to this avoiding processed foods and sugary drinks Also discussed increased activity preferably 30 minutes 5 times per week Plenty of rest and stress control After all of this discussion she has decided to hold off on GLP-1 medicines We did discuss phenteramine how this can be used for 3 months with a follow-up visit in 3 months for accountability If she has further concerns questions or issues she can notify us Recommend repeat lab work in 6 to 12 months

## 2021-04-13 ENCOUNTER — Encounter: Payer: Self-pay | Admitting: Family Medicine

## 2021-04-13 ENCOUNTER — Other Ambulatory Visit: Payer: Self-pay | Admitting: Family Medicine

## 2021-04-13 MED ORDER — AMOXICILLIN 500 MG PO CAPS: 500.0000 mg | ORAL_CAPSULE | Freq: Three times a day (TID) | ORAL | 0 refills | Status: AC

## 2021-04-26 ENCOUNTER — Other Ambulatory Visit: Payer: Self-pay | Admitting: *Deleted

## 2021-04-30 ENCOUNTER — Ambulatory Visit (HOSPITAL_BASED_OUTPATIENT_CLINIC_OR_DEPARTMENT_OTHER): Payer: BC Managed Care – PPO | Admitting: Nurse Practitioner

## 2021-04-30 ENCOUNTER — Encounter (HOSPITAL_BASED_OUTPATIENT_CLINIC_OR_DEPARTMENT_OTHER): Payer: Self-pay

## 2021-05-09 ENCOUNTER — Ambulatory Visit (HOSPITAL_COMMUNITY)
Admission: RE | Admit: 2021-05-09 | Discharge: 2021-05-09 | Disposition: A | Discharge status: Home / Self Care | Payer: BC Managed Care – PPO | Source: Ambulatory Visit | Attending: Vascular Surgery | Admitting: Vascular Surgery

## 2021-05-09 ENCOUNTER — Encounter: Payer: Self-pay | Admitting: Vascular Surgery

## 2021-05-09 ENCOUNTER — Encounter (HOSPITAL_COMMUNITY): Payer: Self-pay

## 2021-05-09 ENCOUNTER — Ambulatory Visit: Payer: BC Managed Care – PPO | Admitting: Vascular Surgery

## 2021-05-09 VITALS — BP 115/73 | HR 79 | Temp 99.8°F | Resp 18 | Ht 69.0 in | Wt 228.8 lb

## 2021-05-09 DIAGNOSIS — I872 Venous insufficiency (chronic) (peripheral): Secondary | ICD-10-CM

## 2021-05-09 DIAGNOSIS — I8391 Asymptomatic varicose veins of right lower extremity: Secondary | ICD-10-CM

## 2021-05-09 NOTE — Progress Notes (Signed)
? ? ?REASON FOR VISIT:  ? ?Follow-up of painful varicose veins of the right lower extremity. ? ?MEDICAL ISSUES:  ? ?PAINFUL VARICOSE VEINS RIGHT LOWER EXTREMITY: This patient has CEAP C2 venous disease.  She has some large varicose veins in her medial right thigh which are under significantly elevated pressure are quite prominent when she is standing.  We discussed the importance of intermittent leg elevation and the proper positioning for this.  In addition we will have her fitted for thigh-high compression stockings with a gradient of 20 to 30 mmHg.  We discussed the importance of exercise specifically walking and water aerobics.  I encouraged her to avoid prolonged sitting and standing.  We also discussed the importance of maintaining a healthy weight.  I will plan on seeing her back in 3 months.  I will get a formal venous reflux study of the right lower extremity that time.  I think if she still having symptoms she would be a candidate for laser ablation of the right great saphenous vein with 10-20 stabs. ? ?HPI:  ? ?Kristin Buckley is a pleasant 41 y.o. female who was seen by Gerri Lins, [place] on 04/26/2020.  At that time she had a chief complaint of varicose veins in the right lower extremity with leg swelling. ? ?She was fitted for thigh-high compression stockings with a gradient of 20 to 30 mmHg, encouraged to elevate her legs and exercise.  She was set up for a 64-month follow-up visit.  ? ?On my history the patient has some large varicose veins in the medial right thigh which are under significantly elevated pressure and very prominent when she is standing.  She describes some aching pain associated with prolonged standing is worse at the end of the day.  Her symptoms are relieved with elevation.  She has been wearing compression stockings with some relief.  She denies any significant swelling. ? ?She denies any previous history of DVT.  She has had no previous venous procedures. ? ?Past Medical History:   ?Diagnosis Date  ? Abnormal Pap smear   ? Anxiety   ? Hx of urinary tract infection   ? Irregular menses 05/04/2012  ? Ovarian cyst 2012  ? right  ? Superficial thrombosis of leg   ? ? ?Family History  ?Problem Relation Age of Onset  ? Hypertension Mother   ? Diabetes Paternal Grandmother   ? Breast cancer Paternal Grandmother   ?     later in life  ? Diabetes Paternal Grandfather   ? Heart disease Father   ? ? ?SOCIAL HISTORY: ?Social History  ? ?Tobacco Use  ? Smoking status: Never  ? Smokeless tobacco: Never  ?Substance Use Topics  ? Alcohol use: No  ? ? ?Allergies  ?Allergen Reactions  ? Celexa [Citalopram Hydrobromide] Nausea Only  ? ? ?Current Outpatient Medications  ?Medication Sig Dispense Refill  ? phentermine (ADIPEX-P) 37.5 MG tablet Take 1 tablet (37.5 mg total) by mouth daily before breakfast. 30 tablet 2  ? ?No current facility-administered medications for this visit.  ? ? ?REVIEW OF SYSTEMS:  ?[X]  denotes positive finding, [ ]  denotes negative finding ?Cardiac  Comments:  ?Chest pain or chest pressure:    ?Shortness of breath upon exertion:    ?Short of breath when lying flat:    ?Irregular heart rhythm:    ?    ?Vascular    ?Pain in calf, thigh, or hip brought on by ambulation:    ?Pain in feet at night  that wakes you up from your sleep:     ?Blood clot in your veins:    ?Leg swelling:     ?    ?Pulmonary    ?Oxygen at home:    ?Productive cough:     ?Wheezing:     ?    ?Neurologic    ?Sudden weakness in arms or legs:     ?Sudden numbness in arms or legs:     ?Sudden onset of difficulty speaking or slurred speech:    ?Temporary loss of vision in one eye:     ?Problems with dizziness:     ?    ?Gastrointestinal    ?Blood in stool:     ?Vomited blood:     ?    ?Genitourinary    ?Burning when urinating:     ?Blood in urine:    ?    ?Psychiatric    ?Major depression:     ?    ?Hematologic    ?Bleeding problems:    ?Problems with blood clotting too easily:    ?    ?Skin    ?Rashes or ulcers:    ?     ?Constitutional    ?Fever or chills:    ? ?PHYSICAL EXAM:  ? ?Vitals:  ? 05/09/21 1318  ?BP: 115/73  ?Pulse: 79  ?Resp: 18  ?Temp: 99.8 ?F (37.7 ?C)  ?TempSrc: Temporal  ?SpO2: 100%  ?Weight: 228 lb 12.8 oz (103.8 kg)  ?Height: 5\' 9"  (1.753 m)  ? ? ?GENERAL: The patient is a well-nourished female, in no acute distress. The vital signs are documented above. ?CARDIAC: There is a regular rate and rhythm.  ?VASCULAR: I do not detect carotid bruits. ?She has palpable pedal pulses. ?She has some prominent varicose veins in her right medial thigh as documented in the photograph below. ? ? ?PULMONARY: There is good air exchange bilaterally without wheezing or rales. ?ABDOMEN: Soft and non-tender with normal pitched bowel sounds.  ?MUSCULOSKELETAL: There are no major deformities or cyanosis. ?NEUROLOGIC: No focal weakness or paresthesias are detected. ?SKIN: There are no ulcers or rashes noted. ?PSYCHIATRIC: The patient has a normal affect. ? ?DATA:   ? ?VENOUS DUPLEX: I reviewed the patient's venous duplex scan from 04/26/2020.  This was of the right lower extremity only. ? ?There was no evidence of DVT. ? ?There was no deep venous reflux. ? ?There was superficial venous reflux from the saphenofemoral junction to the proximal calf.  Diameters of the vein ranged from 4 to 6 mm. ? ? ? ? ?Deitra Mayo ?[facility] ?Office [phone] ?

## 2021-05-23 ENCOUNTER — Other Ambulatory Visit: Payer: Self-pay | Admitting: *Deleted

## 2021-05-23 ENCOUNTER — Telehealth: Payer: BC Managed Care – PPO | Admitting: Physician Assistant

## 2021-05-23 DIAGNOSIS — I8391 Asymptomatic varicose veins of right lower extremity: Secondary | ICD-10-CM

## 2021-05-23 DIAGNOSIS — R3989 Other symptoms and signs involving the genitourinary system: Secondary | ICD-10-CM | POA: Diagnosis not present

## 2021-05-23 MED ORDER — SULFAMETHOXAZOLE-TRIMETHOPRIM 800-160 MG PO TABS: 1.0000 | ORAL_TABLET | Freq: Two times a day (BID) | ORAL | 0 refills | Status: DC

## 2021-05-23 NOTE — Patient Instructions (Signed)
?Flo Shanks, thank you for joining Margaretann Loveless, PA-C for today's virtual visit.  While this provider is not your primary care provider (PCP), if your PCP is located in our provider database this encounter information will be shared with them immediately following your visit. ? ?Consent: ?(Patient) Kristin Buckley provided verbal consent for this virtual visit at the beginning of the encounter. ? ?Current Medications: ? ?Current Outpatient Medications:  ?  sulfamethoxazole-trimethoprim (BACTRIM DS) 800-160 MG tablet, Take 1 tablet by mouth 2 (two) times daily., Disp: 10 tablet, Rfl: 0 ?  phentermine (ADIPEX-P) 37.5 MG tablet, Take 1 tablet (37.5 mg total) by mouth daily before breakfast., Disp: 30 tablet, Rfl: 2  ? ?Medications ordered in this encounter:  ?Meds ordered this encounter  ?Medications  ? sulfamethoxazole-trimethoprim (BACTRIM DS) 800-160 MG tablet  ?  Sig: Take 1 tablet by mouth 2 (two) times daily.  ?  Dispense:  10 tablet  ?  Refill:  0  ?  Order Specific Question:   Supervising Provider  ?  Answer:   Eber Hong [[id number]]  ?  ? ?*If you need refills on other medications prior to your next appointment, please contact your pharmacy* ? ?Follow-Up: ?Call back or seek an in-person evaluation if the symptoms worsen or if the condition fails to improve as anticipated. ? ?Other Instructions ?Urinary Tract Infection, Adult ?A urinary tract infection (UTI) is an infection of any part of the urinary tract. The urinary tract includes the kidneys, ureters, bladder, and urethra. These organs make, store, and get rid of urine in the body. ?An upper UTI affects the ureters and kidneys. A lower UTI affects the bladder and urethra. ?What are the causes? ?Most urinary tract infections are caused by bacteria in your genital area around your urethra, where urine leaves your body. These bacteria grow and cause inflammation of your urinary tract. ?What increases the risk? ?You are more likely to develop this  condition if: ?You have a urinary catheter that stays in place. ?You are not able to control when you urinate or have a bowel movement (incontinence). ?You are female and you: ?Use a spermicide or diaphragm for birth control. ?Have low estrogen levels. ?Are pregnant. ?You have certain genes that increase your risk. ?You are sexually active. ?You take antibiotic medicines. ?You have a condition that causes your flow of urine to slow down, such as: ?An enlarged prostate, if you are female. ?Blockage in your urethra. ?A kidney stone. ?A nerve condition that affects your bladder control (neurogenic bladder). ?Not getting enough to drink, or not urinating often. ?You have certain medical conditions, such as: ?Diabetes. ?A weak disease-fighting system (immunesystem). ?Sickle cell disease. ?Gout. ?Spinal cord injury. ?What are the signs or symptoms? ?Symptoms of this condition include: ?Needing to urinate right away (urgency). ?Frequent urination. This may include small amounts of urine each time you urinate. ?Pain or burning with urination. ?Blood in the urine. ?Urine that smells bad or unusual. ?Trouble urinating. ?Cloudy urine. ?Vaginal discharge, if you are female. ?Pain in the abdomen or the lower back. ?You may also have: ?Vomiting or a decreased appetite. ?Confusion. ?Irritability or tiredness. ?A fever or chills. ?Diarrhea. ?The first symptom in older adults may be confusion. In some cases, they may not have any symptoms until the infection has worsened. ?How is this diagnosed? ?This condition is diagnosed based on your medical history and a physical exam. You may also have other tests, including: ?Urine tests. ?Blood tests. ?Tests for STIs (sexually transmitted  infections). ?If you have had more than one UTI, a cystoscopy or imaging studies may be done to determine the cause of the infections. ?How is this treated? ?Treatment for this condition includes: ?Antibiotic medicine. ?Over-the-counter medicines to treat  discomfort. ?Drinking enough water to stay hydrated. ?If you have frequent infections or have other conditions such as a kidney stone, you may need to see a health care provider who specializes in the urinary tract (urologist). ?In rare cases, urinary tract infections can cause sepsis. Sepsis is a life-threatening condition that occurs when the body responds to an infection. Sepsis is treated in the hospital with IV antibiotics, fluids, and other medicines. ?Follow these instructions at home: ?Medicines ?Take over-the-counter and prescription medicines only as told by your health care provider. ?If you were prescribed an antibiotic medicine, take it as told by your health care provider. Do not stop using the antibiotic even if you start to feel better. ?General instructions ?Make sure you: ?Empty your bladder often and completely. Do not hold urine for long periods of time. ?Empty your bladder after sex. ?Wipe from front to back after urinating or having a bowel movement if you are female. Use each tissue only one time when you wipe. ?Drink enough fluid to keep your urine pale yellow. ?Keep all follow-up visits. This is important. ?Contact a health care provider if: ?Your symptoms do not get better after 1-2 days. ?Your symptoms go away and then return. ?Get help right away if: ?You have severe pain in your back or your lower abdomen. ?You have a fever or chills. ?You have nausea or vomiting. ?Summary ?A urinary tract infection (UTI) is an infection of any part of the urinary tract, which includes the kidneys, ureters, bladder, and urethra. ?Most urinary tract infections are caused by bacteria in your genital area. ?Treatment for this condition often includes antibiotic medicines. ?If you were prescribed an antibiotic medicine, take it as told by your health care provider. Do not stop using the antibiotic even if you start to feel better. ?Keep all follow-up visits. This is important. ?This information is not  intended to replace advice given to you by your health care provider. Make sure you discuss any questions you have with your health care provider. ?Document Revised: 09/10/2019 Document Reviewed: 09/10/2019 ?Elsevier Patient Education ? 2022 [employer] ? ? ? ?If you have been instructed to have an in-person evaluation today at a local Urgent Care facility, please use the link below. It will take you to a list of all of our available [facility], including address, phone number and hours of operation. Please do not delay care.  ?[facility] ? ?If you or a family member do not have a primary care provider, use the link below to schedule a visit and establish care. When you choose a [facility] primary care physician or advanced practice provider, you gain a long-term partner in health. ?Find a Primary Care Provider ? ?Learn more about [facility]'s in-office and virtual care options: ?[facility] Get Care Now ?

## 2021-05-23 NOTE — Progress Notes (Signed)
?Virtual Visit Consent  ? ?Kristin Buckley, you are scheduled for a virtual visit with a [facility] provider today.   ?  ?Just as with appointments in the office, your consent must be obtained to participate.  Your consent will be active for this visit and any virtual visit you may have with one of our providers in the next 365 days.   ?  ?If you have a MyChart account, a copy of this consent can be sent to you electronically.  All virtual visits are billed to your insurance company just like a traditional visit in the office.   ? ?As this is a virtual visit, video technology does not allow for your provider to perform a traditional examination.  This may limit your provider's ability to fully assess your condition.  If your provider identifies any concerns that need to be evaluated in person or the need to arrange testing (such as labs, EKG, etc.), we will make arrangements to do so.   ?  ?Although advances in technology are sophisticated, we cannot ensure that it will always work on either your end or our end.  If the connection with a video visit is poor, the visit may have to be switched to a telephone visit.  With either a video or telephone visit, we are not always able to ensure that we have a secure connection.    ? ?I need to obtain your verbal consent now.   Are you willing to proceed with your visit today?  ?  ?Kristin Buckley has provided verbal consent on 05/23/2021 for a virtual visit (video or telephone). ?  ?[name], PA-C  ? ?Date: 05/23/2021 12:30 PM ? ? ?Virtual Visit via Video Note  ? ?Kristin Buckley, connected with  Kristin Buckley  ([id number], [birth year]) on 05/23/21 at 12:30 PM EDT by a video-enabled telemedicine application and verified that I am speaking with the correct person using two identifiers. ? ?Location: ?Patient: Virtual Visit Location Patient: Mobile ?Provider: Virtual Visit Location Provider: Home Office ?  ?I discussed the limitations of evaluation and  management by telemedicine and the availability of in person appointments. The patient expressed understanding and agreed to proceed.   ? ?History of Present Illness: ?Kristin Buckley is a 41 y.o. who identifies as a female who was assigned female at birth, and is being seen today for possible UTI. ? ?HPI: Urinary Tract Infection  ?This is a new problem. The current episode started today. The problem occurs every urination. The problem has been gradually worsening. The quality of the pain is described as aching and burning. The pain is mild. There has been no fever. Associated symptoms include frequency, hesitancy and urgency. Pertinent negatives include no chills, discharge, flank pain, hematuria, nausea or vomiting. She has tried increased fluids and NSAIDs (AZO) for the symptoms. The treatment provided no relief. There is no history of catheterization or recurrent UTIs.   ? ? ?Problems:  ?Patient Active Problem List  ? Diagnosis Date Noted  ? PMDD (premenstrual dysphoric disorder) 03/05/2019  ? Hx of abnormal Pap smear 05/04/2012  ? Hematuria 05/04/2012  ? Irregular menses 05/04/2012  ?  ?Allergies:  ?Allergies  ?Allergen Reactions  ? Celexa [Citalopram Hydrobromide] Nausea Only  ? ?Medications:  ?Current Outpatient Medications:  ?  sulfamethoxazole-trimethoprim (BACTRIM DS) 800-160 MG tablet, Take 1 tablet by mouth 2 (two) times daily., Disp: 10 tablet, Rfl: 0 ?  phentermine (ADIPEX-P) 37.5 MG tablet, Take 1 tablet (37.5  mg total) by mouth daily before breakfast., Disp: 30 tablet, Rfl: 2 ? ?Observations/Objective: ?Patient is well-developed, well-nourished in no acute distress.  ?Resting comfortably ?Head is normocephalic, atraumatic.  ?No labored breathing.  ?Speech is clear and coherent with logical content.  ?Patient is alert and oriented at baseline.  ? ? ?Assessment and Plan: ?1. Suspected UTI ?- sulfamethoxazole-trimethoprim (BACTRIM DS) 800-160 MG tablet; Take 1 tablet by mouth 2 (two) times daily.   Dispense: 10 tablet; Refill: 0 ? ?- Worsening symptoms. ?- Will treat empirically with Bactrim  ?- Continue to push fluids.  ?- She is to seek in person evaluation if symptoms do not improve or if they worsen.  ? ? ?Follow Up Instructions: ?I discussed the assessment and treatment plan with the patient. The patient was provided an opportunity to ask questions and all were answered. The patient agreed with the plan and demonstrated an understanding of the instructions.  A copy of instructions were sent to the patient via MyChart unless otherwise noted below.  ? ? ?The patient was advised to call back or seek an in-person evaluation if the symptoms worsen or if the condition fails to improve as anticipated. ? ?Time:  ?I spent 10 minutes with the patient via telehealth technology discussing the above problems/concerns.   ? ?[name], PA-C ?

## 2021-07-03 ENCOUNTER — Ambulatory Visit: Payer: BC Managed Care – PPO | Admitting: Family Medicine

## 2021-07-11 ENCOUNTER — Encounter: Payer: Self-pay | Admitting: Family Medicine

## 2021-08-08 ENCOUNTER — Encounter (HOSPITAL_COMMUNITY): Payer: BC Managed Care – PPO

## 2021-08-08 ENCOUNTER — Ambulatory Visit: Payer: BC Managed Care – PPO | Admitting: Vascular Surgery

## 2021-10-08 ENCOUNTER — Other Ambulatory Visit: Payer: Self-pay | Admitting: Obstetrics & Gynecology

## 2021-10-08 DIAGNOSIS — Z1231 Encounter for screening mammogram for malignant neoplasm of breast: Secondary | ICD-10-CM

## 2021-11-22 ENCOUNTER — Ambulatory Visit: Payer: BC Managed Care – PPO

## 2021-12-28 ENCOUNTER — Ambulatory Visit
Admission: RE | Admit: 2021-12-28 | Discharge: 2021-12-28 | Disposition: A | Discharge status: Home / Self Care | Payer: BC Managed Care – PPO | Source: Ambulatory Visit | Attending: Obstetrics & Gynecology | Admitting: Obstetrics & Gynecology

## 2021-12-28 DIAGNOSIS — Z1231 Encounter for screening mammogram for malignant neoplasm of breast: Secondary | ICD-10-CM | POA: Diagnosis not present

## 2022-03-14 ENCOUNTER — Encounter (INDEPENDENT_AMBULATORY_CARE_PROVIDER_SITE_OTHER): Payer: BC Managed Care – PPO | Admitting: Internal Medicine

## 2022-05-01 ENCOUNTER — Telehealth: Payer: BC Managed Care – PPO | Admitting: Family

## 2022-05-01 DIAGNOSIS — B3731 Acute candidiasis of vulva and vagina: Secondary | ICD-10-CM | POA: Diagnosis not present

## 2022-05-01 MED ORDER — FLUCONAZOLE 150 MG PO TABS: 150.0000 mg | ORAL_TABLET | Freq: Once | ORAL | 0 refills | Status: AC

## 2022-05-01 NOTE — Patient Instructions (Signed)
  Kristin Buckley, thank you for joining Chevis Pretty, FNP for today's virtual visit.  While this provider is not your primary care provider (PCP), if your PCP is located in our provider database this encounter information will be shared with them immediately following your visit.   [facility] account gives you access to today's visit and all your visits, tests, and labs performed at [facility] " click here if you don't have a [facility] account or go to [email or web address]  Consent: (Patient) Kristin Buckley provided verbal consent for this virtual visit at the beginning of the encounter.  Current Medications:  Current Outpatient Medications:    fluconazole (DIFLUCAN) 150 MG tablet, Take 1 tablet (150 mg total) by mouth once for 1 dose., Disp: 1 tablet, Rfl: 0   phentermine (ADIPEX-P) 37.5 MG tablet, Take 1 tablet (37.5 mg total) by mouth daily before breakfast., Disp: 30 tablet, Rfl: 2   sulfamethoxazole-trimethoprim (BACTRIM DS) 800-160 MG tablet, Take 1 tablet by mouth 2 (two) times daily., Disp: 10 tablet, Rfl: 0   Medications ordered in this encounter:  Meds ordered this encounter  Medications   fluconazole (DIFLUCAN) 150 MG tablet    Sig: Take 1 tablet (150 mg total) by mouth once for 1 dose.    Dispense:  1 tablet    Refill:  0    Order Specific Question:   Supervising Provider    Answer:   Chase Picket [id number]     *If you need refills on other medications prior to your next appointment, please contact your pharmacy*  Follow-Up: Call back or seek an in-person evaluation if the symptoms worsen or if the condition fails to improve as anticipated.  [facility] [phone]  Other Instructions Avoid bubble baths No harsh soap in perineal area Avoid scratching   If you have been instructed to have an in-person evaluation today at a local Urgent Care facility, please use the link below. It will take you to a  list of all of our available [facility], including address, phone number and hours of operation. Please do not delay care.  [facility]  If you or a family member do not have a primary care provider, use the link below to schedule a visit and establish care. When you choose a [facility] primary care physician or advanced practice provider, you gain a long-term partner in health. Find a Primary Care Provider  Learn more about 's in-office and virtual care options: [facility]

## 2022-05-01 NOTE — Progress Notes (Signed)
Virtual Visit Consent   ANNETA Buckley, you are scheduled for a virtual visit with Mary-Margaret Hassell Done, [place], a [facility] provider, today.     Just as with appointments in the office, your consent must be obtained to participate.  Your consent will be active for this visit and any virtual visit you may have with one of our providers in the next 365 days.     If you have a MyChart account, a copy of this consent can be sent to you electronically.  All virtual visits are billed to your insurance company just like a traditional visit in the office.    As this is a virtual visit, video technology does not allow for your provider to perform a traditional examination.  This may limit your provider's ability to fully assess your condition.  If your provider identifies any concerns that need to be evaluated in person or the need to arrange testing (such as labs, EKG, etc.), we will make arrangements to do so.     Although advances in technology are sophisticated, we cannot ensure that it will always work on either your end or our end.  If the connection with a video visit is poor, the visit may have to be switched to a telephone visit.  With either a video or telephone visit, we are not always able to ensure that we have a secure connection.     I need to obtain your verbal consent now.   Are you willing to proceed with your visit today? YES   [name] has provided verbal consent on 05/01/2022 for a virtual visit (video or telephone).   Mary-Margaret Hassell Done, FNP   Date: 05/01/2022 3:22 PM   Virtual Visit via Video Note   I, Mary-Margaret Hassell Done, connected with Kristin Buckley ([id number], 01/20/1981) on 05/01/22 at  3:45 PM EDT by a video-enabled telemedicine application and verified that I am speaking with the correct person using two identifiers.  Location: Patient: Virtual Visit Location Patient: Home Provider: Virtual Visit Location Provider: Mobile   I discussed the limitations  of evaluation and management by telemedicine and the availability of in person appointments. The patient expressed understanding and agreed to proceed.    History of Present Illness: Kristin Buckley is a 42 y.o. who identifies as a female who was assigned female at birth, and is being seen today for vaginel discharge.  HPI: Patient took a bubble bath night before last with some new bath and body works soap.  Vaginal Discharge The patient's primary symptoms include genital itching and vaginal discharge. This is a new problem. The current episode started yesterday. The problem occurs constantly. She is not pregnant. The vaginal discharge was clear. There has been no bleeding. She has not been passing clots. She has not been passing tissue. Nothing aggravates the symptoms. She has tried nothing for the symptoms.    Review of Systems  Genitourinary:  Positive for vaginal discharge.    Problems:  Patient Active Problem List   Diagnosis Date Noted   PMDD (premenstrual dysphoric disorder) 03/05/2019   Hx of abnormal Pap smear 05/04/2012   Hematuria 05/04/2012   Irregular menses 05/04/2012    Allergies:  Allergies  Allergen Reactions   Celexa [Citalopram Hydrobromide] Nausea Only   Medications:  Current Outpatient Medications:    phentermine (ADIPEX-P) 37.5 MG tablet, Take 1 tablet (37.5 mg total) by mouth daily before breakfast., Disp: 30 tablet, Rfl: 2   sulfamethoxazole-trimethoprim (BACTRIM DS) 800-160 MG  tablet, Take 1 tablet by mouth 2 (two) times daily., Disp: 10 tablet, Rfl: 0  Observations/Objective: Patient is well-developed, well-nourished in no acute distress.  Resting comfortably  at home.  Head is normocephalic, atraumatic.  No labored breathing.  Speech is clear and coherent with logical content.  Patient is alert and oriented at baseline.    Assessment and Plan:  [name] in today with chief complaint of Vaginal Discharge   1. Vaginal candidiasis Avoid  bubble baths No harsh soap in perineal area Avoid scratching - fluconazole (DIFLUCAN) 150 MG tablet; Take 1 tablet (150 mg total) by mouth once for 1 dose.  Dispense: 1 tablet; Refill: 0   Follow Up Instructions: I discussed the assessment and treatment plan with the patient. The patient was provided an opportunity to ask questions and all were answered. The patient agreed with the plan and demonstrated an understanding of the instructions.  A copy of instructions were sent to the patient via MyChart.  The patient was advised to call back or seek an in-person evaluation if the symptoms worsen or if the condition fails to improve as anticipated.  Time:  I spent 5 minutes with the patient via telehealth technology discussing the above problems/concerns.    Mary-Margaret Hassell Done, FNP

## 2022-05-04 ENCOUNTER — Emergency Department (HOSPITAL_COMMUNITY)
Admission: EM | Admit: 2022-05-04 | Discharge: 2022-05-04 | Disposition: A | Discharge status: Home / Self Care | Payer: BC Managed Care – PPO | Attending: Emergency Medicine | Admitting: Emergency Medicine

## 2022-05-04 ENCOUNTER — Emergency Department (HOSPITAL_COMMUNITY): Payer: BC Managed Care – PPO

## 2022-05-04 ENCOUNTER — Encounter (HOSPITAL_COMMUNITY): Payer: Self-pay

## 2022-05-04 ENCOUNTER — Other Ambulatory Visit: Payer: Self-pay

## 2022-05-04 DIAGNOSIS — X58XXXA Exposure to other specified factors, initial encounter: Secondary | ICD-10-CM | POA: Diagnosis not present

## 2022-05-04 DIAGNOSIS — T182XXA Foreign body in stomach, initial encounter: Secondary | ICD-10-CM | POA: Diagnosis not present

## 2022-05-04 DIAGNOSIS — Z0389 Encounter for observation for other suspected diseases and conditions ruled out: Secondary | ICD-10-CM | POA: Diagnosis not present

## 2022-05-04 DIAGNOSIS — T189XXA Foreign body of alimentary tract, part unspecified, initial encounter: Secondary | ICD-10-CM | POA: Diagnosis not present

## 2022-05-04 DIAGNOSIS — J3489 Other specified disorders of nose and nasal sinuses: Secondary | ICD-10-CM | POA: Diagnosis not present

## 2022-05-04 NOTE — ED Provider Notes (Addendum)
[facility] Provider Note   CSN: [id number] Arrival date & time: 05/04/22  1840     History  Chief Complaint  Patient presents with   Swallowed Foreign Body    Kristin Buckley is a 42 y.o. female.  Patient was on her way to work and she was eating chicken salad and a wire from her braces broke off and she swallowed it but she is got a very strong foreign body sensation kind of at her Adam's apple area.  Most likely sensation.  No trouble breathing.  No significant abdominal pain.  Past medical history significant for ovarian cyst.  Patient is never used tobacco products.       Home Medications Prior to Admission medications   Medication Sig Start Date End Date Taking? Authorizing Provider  phentermine (ADIPEX-P) 37.5 MG tablet Take 1 tablet (37.5 mg total) by mouth daily before breakfast. 04/05/21   Kathyrn Drown, MD  sulfamethoxazole-trimethoprim (BACTRIM DS) 800-160 MG tablet Take 1 tablet by mouth 2 (two) times daily. 05/23/21   [name], PA-C      Allergies    Celexa [citalopram hydrobromide]    Review of Systems   Review of Systems  Constitutional:  Negative for chills and fever.  HENT:  Positive for trouble swallowing. Negative for ear pain, sore throat and voice change.   Eyes:  Negative for pain and visual disturbance.  Respiratory:  Negative for cough and shortness of breath.   Cardiovascular:  Negative for chest pain and palpitations.  Gastrointestinal:  Negative for abdominal pain and vomiting.  Genitourinary:  Negative for dysuria and hematuria.  Musculoskeletal:  Negative for arthralgias and back pain.  Skin:  Negative for color change and rash.  Neurological:  Negative for seizures and syncope.  All other systems reviewed and are negative.   Physical Exam Updated Vital Signs BP (!) 155/98   Pulse 91   Temp 98 F (36.7 C)   Resp 15   Ht 1.803 m (5\' 11" )   Wt 104.3 kg   SpO2 100%   BMI 32.08  kg/m  Physical Exam Vitals and nursing note reviewed.  Constitutional:      General: She is not in acute distress.    Appearance: Normal appearance. She is well-developed.  HENT:     Head: Normocephalic and atraumatic.     Mouth/Throat:     Mouth: Mucous membranes are moist.     Comments: Brace wire missing between right-sided lower posterior molar and a molar that has a gap. Eyes:     Extraocular Movements: Extraocular movements intact.     Conjunctiva/sclera: Conjunctivae normal.     Pupils: Pupils are equal, round, and reactive to light.  Cardiovascular:     Rate and Rhythm: Normal rate and regular rhythm.     Heart sounds: No murmur heard. Pulmonary:     Effort: Pulmonary effort is normal. No respiratory distress.     Breath sounds: Normal breath sounds.  Abdominal:     Palpations: Abdomen is soft.     Tenderness: There is no abdominal tenderness.  Musculoskeletal:        General: No swelling.     Cervical back: Neck supple.  Skin:    General: Skin is warm and dry.     Capillary Refill: Capillary refill takes less than 2 seconds.  Neurological:     Mental Status: She is alert.  Psychiatric:        Mood  and Affect: Mood normal.     ED Results / Procedures / Treatments   Labs (all labs ordered are listed, but only abnormal results are displayed) Labs Reviewed - No data to display  EKG None  Radiology DG Abd Acute W/Chest  Result Date: 05/04/2022 CLINICAL DATA:  Concern for foreign body ingestion. EXAM: DG ABDOMEN ACUTE WITH 1 VIEW CHEST COMPARISON:  None Available. FINDINGS: No radiopaque foreign object identified in the chest. There is a 2.2 cm linear foreign object in the left upper abdomen, likely in the body of the stomach. The lungs are clear. There is no pleural effusion or pneumothorax. The cardiac silhouette is within limits. No bowel dilatation or evidence of obstruction. No free air or radiopaque calculi. The osseous structures are intact. The soft tissues  are unremarkable. IMPRESSION: 1. Linear foreign object in the left upper abdomen, likely in the body of the stomach. 2. No acute cardiopulmonary process. Electronically Signed   By: Anner Crete M.D.   On: 05/04/2022 19:48    Procedures Procedures    Medications Ordered in ED Medications - No data to display  ED Course/ Medical Decision Making/ A&P                             Medical Decision Making Amount and/or Complexity of Data Reviewed Radiology: ordered.   X-ray of chest abdomen and pelvis shows a linear foreign body in the left upper abdomen likely in the body of the stomach.  Patient is concerned there may have been 2 wires.  So we will do soft tissue x-rays of the neck.  Patient nontoxic no acute distress.  Oropharynx is clear.  Can see where the brace wire is missing from.  Soft tissue x-ray of the neck raise some concerns about a foreign body.  Radiology recommended CT soft tissue neck without contrast.  That was negative for foreign body.  That area of concern was calcification.  Patient remained stable.  Patient stable for discharge with precautions.  It appears that the wire foreign body from her braces is in the stomach.   Final Clinical Impression(s) / ED Diagnoses Final diagnoses:  Swallowed foreign body, initial encounter    Rx / DC Orders ED Discharge Orders     None         Fredia Sorrow, MD 05/04/22 2057    Fredia Sorrow, MD 05/04/22 2324

## 2022-05-04 NOTE — ED Triage Notes (Signed)
Pt was eating chicken salad and a piece of her braces broke off and pt swallowed it. States she can feel it in her esophagus and feels like it is poking her. Did not drink anything, does not feel that it has moved since ingestion.   Denies difficulty breathing at this time. Able to speak in complete sentences.

## 2022-05-04 NOTE — Discharge Instructions (Addendum)
CT scan and x-rays show the wire from the braces to be in the stomach.  Should pass it from this point forward without any difficulties.  Return for any abdominal pain nausea vomiting or any blood in the bowel movements.  Work note provided.  As we discussed CT scan of the soft tissue neck showed no evidence of any foreign body there.

## 2022-05-04 NOTE — ED Notes (Signed)
Patient transported to CT 

## 2022-06-28 ENCOUNTER — Encounter: Payer: Self-pay | Admitting: Nurse Practitioner

## 2022-06-28 ENCOUNTER — Ambulatory Visit (INDEPENDENT_AMBULATORY_CARE_PROVIDER_SITE_OTHER): Payer: BC Managed Care – PPO | Admitting: Nurse Practitioner

## 2022-06-28 VITALS — BP 115/77 | HR 78 | Temp 98.3°F | Ht 71.0 in | Wt 238.0 lb

## 2022-06-28 DIAGNOSIS — B360 Pityriasis versicolor: Secondary | ICD-10-CM | POA: Insufficient documentation

## 2022-06-28 DIAGNOSIS — J301 Allergic rhinitis due to pollen: Secondary | ICD-10-CM

## 2022-06-28 MED ORDER — TRIAMCINOLONE ACETONIDE 0.1 % EX CREA: 1.0000 | TOPICAL_CREAM | Freq: Two times a day (BID) | CUTANEOUS | 0 refills | Status: DC

## 2022-06-28 MED ORDER — FLUCONAZOLE 150 MG PO TABS: ORAL_TABLET | ORAL | 0 refills | Status: DC

## 2022-06-28 NOTE — Progress Notes (Addendum)
Subjective:    Patient ID: Kristin Buckley, female    DOB: 1981/01/12, 42 y.o.   MRN: [id number]  HPI  Rash on neck, back of legs and inner thighs about a month on and off Ears feel stopped up since yesterday mowing the lawn Presents for complaints of a rash on the frontal neck area on the upper inner thighs and back of the legs for over a month.  Slightly better at times but never completely goes away.  Patient noticed improvement while she was on fluconazole 150 mg x 1 dose for her vaginal yeast infection but it eventually came back.  Very pruritic with slight burning at times. Also complaints of ear pressure bilaterally worse on the left after cutting her grass last night.  No sore throat.  No fever.  No cough shortness of breath or wheezing.    06/28/2022   10:38 AM  Depression screen PHQ 2/9  Decreased Interest 0  Down, Depressed, Hopeless 1  PHQ - 2 Score 1  Altered sleeping 0  Tired, decreased energy 1  Change in appetite 1  Feeling bad or failure about yourself  0  Trouble concentrating 0  Moving slowly or fidgety/restless 0  Suicidal thoughts 0  PHQ-9 Score 3  Difficult doing work/chores Somewhat difficult      06/28/2022   10:38 AM  GAD 7 : Generalized Anxiety Score  Nervous, Anxious, on Edge 1  Control/stop worrying 1  Worry too much - different things 1  Trouble relaxing 0  Restless 0  Easily annoyed or irritable 1  Afraid - awful might happen 0  Total GAD 7 Score 4  Anxiety Difficulty Somewhat difficult          Objective:   Physical Exam NAD.  Alert, oriented.  TMs retracted bilaterally, no erythema.  Nasal mucosa pale and mildly boggy more on the left.  Pharynx minimally injected with clear PND noted.  Neck supple with mild soft anterior adenopathy.  Lungs clear.  Heart regular rate rhythm.  Patient has a dark tan.  Several oval-shaped moderately hypopigmented lesions noted on the anterior lower neck area going into the left neck area.  The edges of the  lesions are slightly scaly.  Some have coalesced into a larger area.  In the upper inner thighs there is generalized hypopigmentation and dry skin noted.  A few smaller similar lesions are noted on the back of the legs. Today's Vitals   06/28/22 1028  BP: 115/77  Pulse: 78  Temp: 98.3 F (36.8 C)  SpO2: 99%  Weight: 238 lb (108 kg)  Height: 5\' 11"  (1.803 m)   Body mass index is 33.19 kg/m.        Assessment & Plan:   Problem List Items Addressed This Visit       Musculoskeletal and Integument   Tinea versicolor - Primary   Relevant Medications   fluconazole (DIFLUCAN) 150 MG tablet   Other Visit Diagnoses     Seasonal allergic rhinitis due to pollen          Meds ordered this encounter  Medications   fluconazole (DIFLUCAN) 150 MG tablet    Sig: Take 2 tabs po today; then take 2 tabs po in 2 weeks    Dispense:  4 tablet    Refill:  0    Order Specific Question:   Supervising Provider    Answer:   Lilyan Punt A [[id number]]   triamcinolone cream (KENALOG) 0.1 %  Sig: Apply 1 Application topically 2 (two) times daily. Prn rash and itching    Dispense:  30 g    Refill:  0    Order Specific Question:   Supervising Provider    Answer:   Lilyan Punt A [[id number]]   OTC antihistamine and steroid nasal spray as directed.  Recommend she wear a mask when cutting grass. Take fluconazole as directed for tinea infection.  Also triamcinolone twice daily as needed for itching.  Patient understands and will take weeks to months for the skin time to come back to normal even when the tinea is resolved.  Call back if new lesions or if rash persists. Note that patient gets her physicals with gynecology. Patient states she has been emotional especially this week since her oldest son is leaving to go to school.  Denies any other problems.

## 2022-07-20 DIAGNOSIS — J069 Acute upper respiratory infection, unspecified: Secondary | ICD-10-CM | POA: Diagnosis not present

## 2022-09-11 ENCOUNTER — Ambulatory Visit: Payer: BC Managed Care – PPO | Admitting: Nurse Practitioner

## 2022-09-25 ENCOUNTER — Ambulatory Visit: Payer: BC Managed Care – PPO | Admitting: Nurse Practitioner

## 2022-09-26 ENCOUNTER — Other Ambulatory Visit: Payer: Self-pay | Admitting: Obstetrics & Gynecology

## 2022-09-26 DIAGNOSIS — Z1231 Encounter for screening mammogram for malignant neoplasm of breast: Secondary | ICD-10-CM

## 2022-12-25 IMAGING — MG MM DIGITAL SCREENING BILAT W/ TOMO AND CAD
8 series · 8 of 24 positions shown · non-contrast
Comparison: None.

CLINICAL DATA: Screening.

EXAM:
DIGITAL SCREENING BILATERAL MAMMOGRAM WITH TOMOSYNTHESIS AND CAD
TECHNIQUE: Bilateral screening digital craniocaudal and mediolateral oblique
mammograms were obtained. Bilateral screening digital breast
tomosynthesis was performed. The images were evaluated with
computer-aided detection.

[R MLO synth-2D]
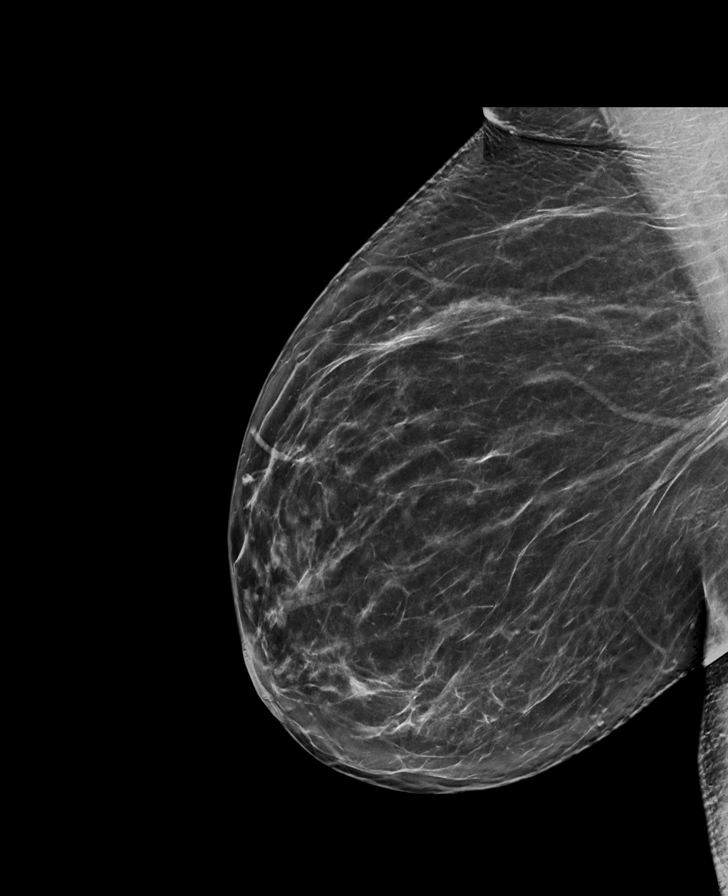

[L CC synth-2D]
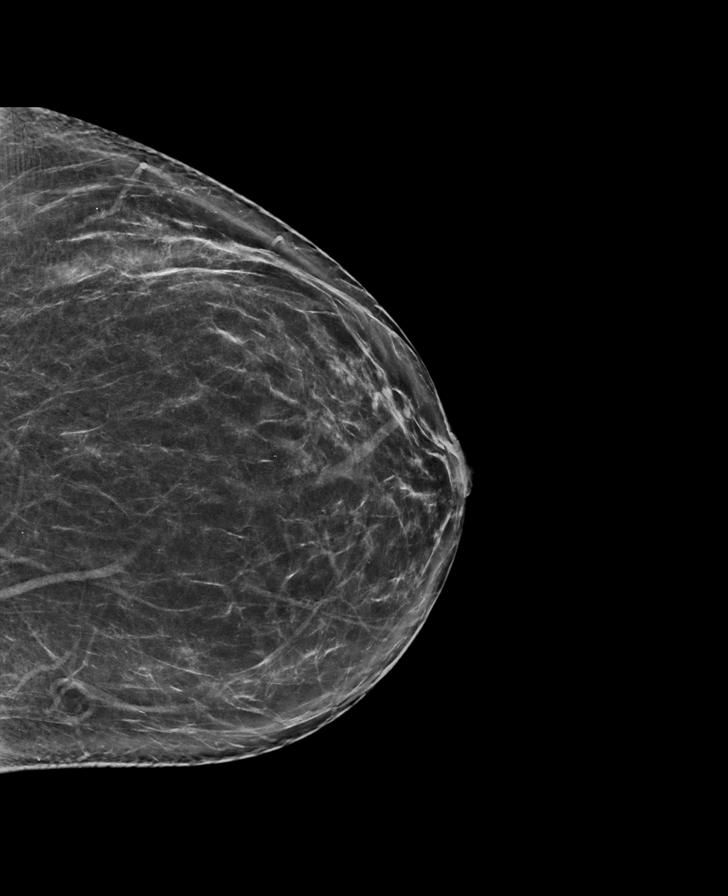

[L MLO synth-2D]
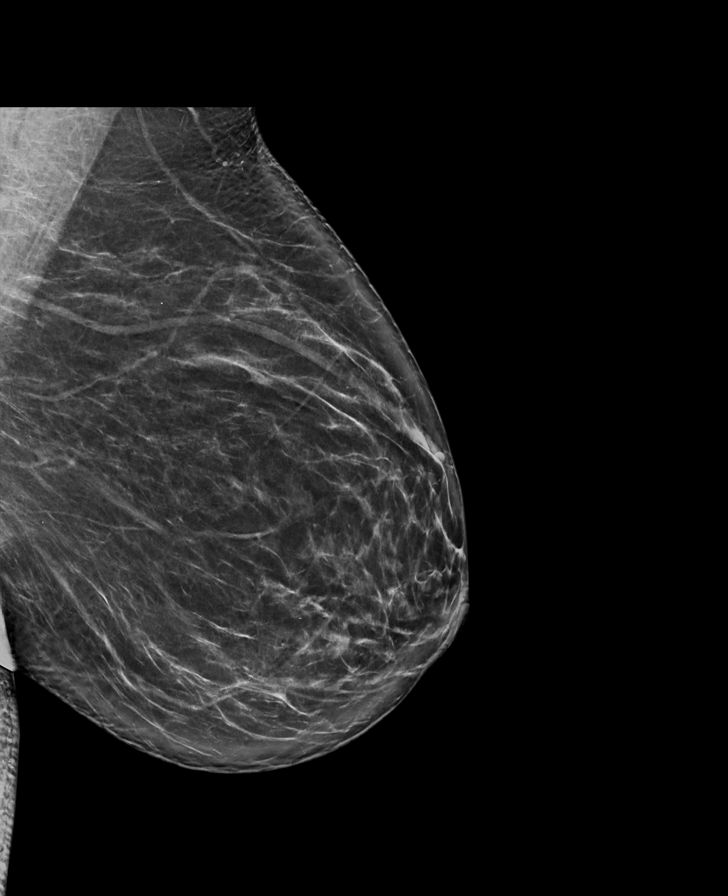

[R CC synth-2D]
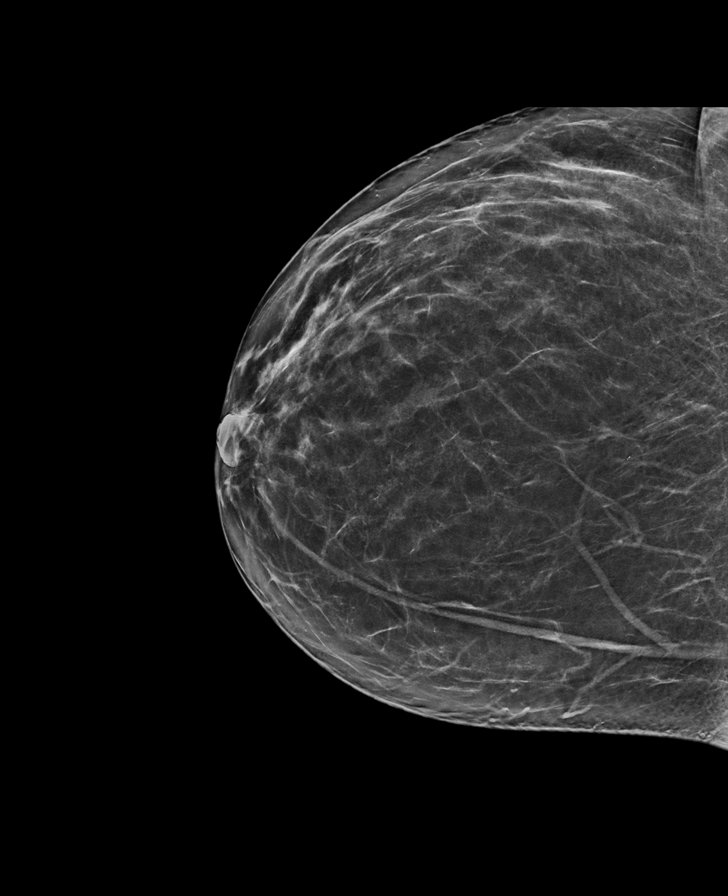

[L MLO tomo · tomo slice 45/90.0]
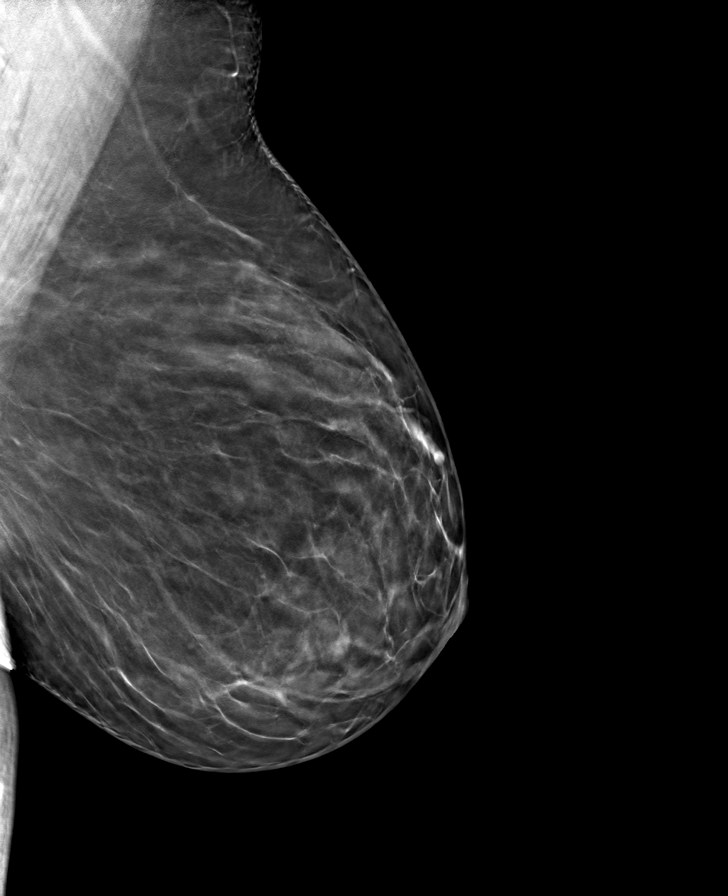

[R CC tomo · tomo slice 41/80.0]
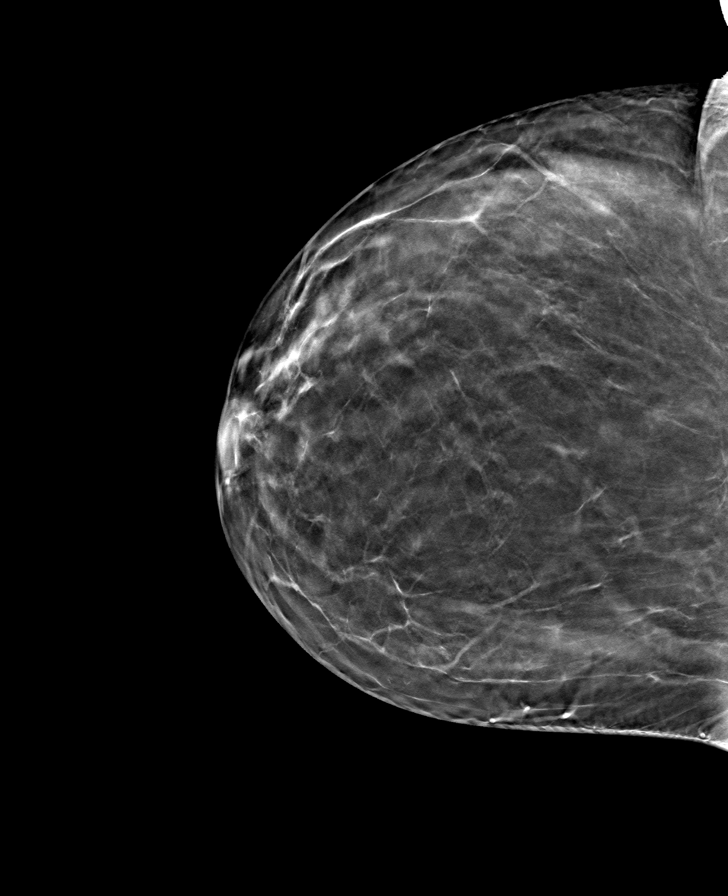

[L CC tomo · tomo slice 43/85.0]
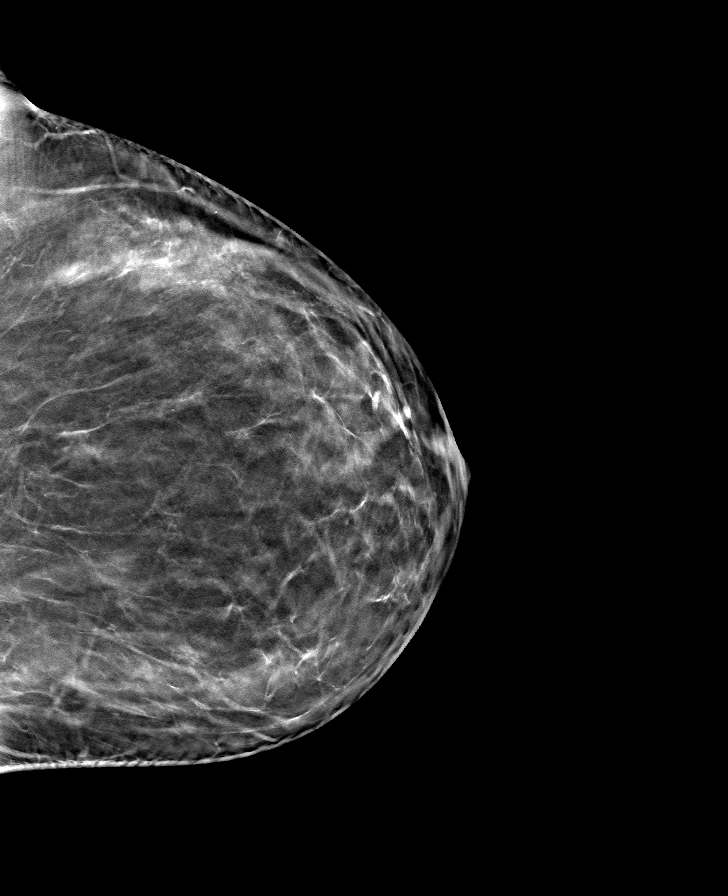

[R MLO tomo · tomo slice 45/90.0]
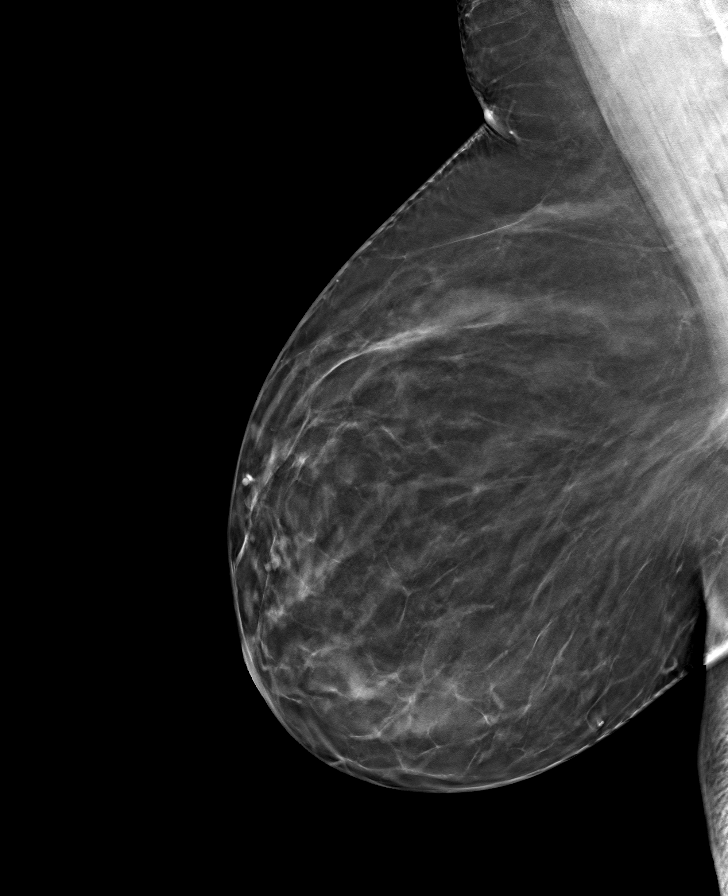

[8 of 24 positions shown; findings below may reference images not displayed]

ACR Breast Density Category b: There are scattered areas of
fibroglandular density.
FINDINGS: There are no findings suspicious for malignancy.
IMPRESSION: No mammographic evidence of malignancy. A result letter of this
screening mammogram will be mailed directly to the patient.

RECOMMENDATION:
Screening mammogram in one year. (Code:XG-X-X7B)

BI-RADS CATEGORY  1: Negative.

## 2022-12-30 ENCOUNTER — Ambulatory Visit
Admission: RE | Admit: 2022-12-30 | Discharge: 2022-12-30 | Disposition: A | Discharge status: Home / Self Care | Payer: BC Managed Care – PPO | Source: Ambulatory Visit | Attending: Obstetrics & Gynecology | Admitting: Obstetrics & Gynecology

## 2022-12-30 DIAGNOSIS — Z1231 Encounter for screening mammogram for malignant neoplasm of breast: Secondary | ICD-10-CM

## 2023-04-14 ENCOUNTER — Ambulatory Visit (HOSPITAL_BASED_OUTPATIENT_CLINIC_OR_DEPARTMENT_OTHER): Payer: BC Managed Care – PPO | Admitting: Obstetrics & Gynecology

## 2023-04-14 ENCOUNTER — Encounter (HOSPITAL_BASED_OUTPATIENT_CLINIC_OR_DEPARTMENT_OTHER): Payer: Self-pay | Admitting: Obstetrics & Gynecology

## 2023-04-14 VITALS — BP 125/86 | HR 68 | Ht 69.0 in | Wt 194.0 lb

## 2023-04-14 DIAGNOSIS — Z0000 Encounter for general adult medical examination without abnormal findings: Secondary | ICD-10-CM

## 2023-04-14 DIAGNOSIS — Z6828 Body mass index (BMI) 28.0-28.9, adult: Secondary | ICD-10-CM

## 2023-04-14 DIAGNOSIS — B360 Pityriasis versicolor: Secondary | ICD-10-CM

## 2023-04-14 DIAGNOSIS — Z0001 Encounter for general adult medical examination with abnormal findings: Secondary | ICD-10-CM | POA: Diagnosis not present

## 2023-04-14 DIAGNOSIS — Z8742 Personal history of other diseases of the female genital tract: Secondary | ICD-10-CM

## 2023-04-14 DIAGNOSIS — Z01419 Encounter for gynecological examination (general) (routine) without abnormal findings: Secondary | ICD-10-CM

## 2023-04-14 DIAGNOSIS — E7800 Pure hypercholesterolemia, unspecified: Secondary | ICD-10-CM | POA: Diagnosis not present

## 2023-04-14 LAB — LIPID PANEL
Chol/HDL Ratio: 2.9 ratio (ref 0.0–4.4)
Cholesterol, Total: 171 mg/dL (ref 100–199)
HDL: 58 mg/dL (ref >39)
LDL Chol Calc (NIH): 100 mg/dL — ABNORMAL HIGH (ref 0–99)
Triglycerides: 70 mg/dL (ref 0–149)
VLDL Cholesterol Cal: 13 mg/dL (ref 5–40)

## 2023-04-14 LAB — COMPREHENSIVE METABOLIC PANEL
ALT: 13 IU/L (ref 0–32)
AST: 17 IU/L (ref 0–40)
Albumin: 4.6 g/dL (ref 3.9–4.9)
Alkaline Phosphatase: 46 IU/L (ref 44–121)
BUN/Creatinine Ratio: 15 (ref 9–23)
BUN: 10 mg/dL (ref 6–24)
Bilirubin Total: 0.8 mg/dL (ref 0.0–1.2)
CO2: 22 mmol/L (ref 20–29)
Calcium: 10 mg/dL (ref 8.7–10.2)
Chloride: 104 mmol/L (ref 96–106)
Creatinine, Ser: 0.66 mg/dL (ref 0.57–1.00)
Globulin, Total: 2.8 g/dL (ref 1.5–4.5)
Glucose: 85 mg/dL (ref 70–99)
Potassium: 4.4 mmol/L (ref 3.5–5.2)
Sodium: 141 mmol/L (ref 134–144)
Total Protein: 7.4 g/dL (ref 6.0–8.5)
eGFR: 112 mL/min/{1.73_m2} (ref >59)

## 2023-04-14 LAB — HEMOGLOBIN A1C
Est. average glucose Bld gHb Est-mCnc: 111 mg/dL
Hgb A1c MFr Bld: 5.5 % (ref 4.8–5.6)

## 2023-04-14 MED ORDER — NYSTATIN 100000 UNIT/GM EX CREA: 1.0000 | TOPICAL_CREAM | Freq: Two times a day (BID) | CUTANEOUS | 0 refills | Status: DC

## 2023-04-14 MED ORDER — FLUCONAZOLE 150 MG PO TABS: ORAL_TABLET | ORAL | 0 refills | Status: DC

## 2023-04-14 NOTE — Progress Notes (Signed)
 ANNUAL EXAM Patient name: Kristin Buckley MRN [id number]  Date of birth: 01-Apr-1980 Chief Complaint:   Gynecologic Exam (C/o rash in groin area; has used fluconazole in past and that helped but it comes back. )  History of Present Illness:   Kristin Buckley is a 43 y.o. [phone] Caucasian female being seen today for a routine annual exam.  Menstrual cycles are normal.  Flow heavier the first few days.  Flow lasts 5 days, typically.  She is using a compounded semaglutide.  Has lost weight.  Not having blood work done with this medication so will obtained some today.   Patient's last menstrual period was 03/31/2023 (approximate).   Upstream - 04/14/23 1011       Pregnancy Intention Screening   Does the patient want to become pregnant in the next year? No    Does the patient's partner want to become pregnant in the next year? No    Would the patient like to discuss contraceptive options today? No      Contraception Wrap Up   Current Method Female Sterilization             Last pap 03/26/21. Results were: NILM w/ HRHPV negative. H/O abnormal pap: yes remote hx Last mammogram: 12/30/22. Results were: normal. Family h/o breast cancer: yes paternal grandmother Last colonoscopy: guidelines reviewed.  .Family h/o colorectal cancer: no     04/14/2023   10:11 AM 06/28/2022   10:38 AM 03/26/2021   10:53 AM 02/23/2016    2:05 PM  Depression screen PHQ 2/9  Decreased Interest 0 0 0 0  Down, Depressed, Hopeless 0 1 0 0  PHQ - 2 Score 0 1 0 0  Altered sleeping  0    Tired, decreased energy  1    Change in appetite  1    Feeling bad or failure about yourself   0    Trouble concentrating  0    Moving slowly or fidgety/restless  0    Suicidal thoughts  0    PHQ-9 Score  3    Difficult doing work/chores  Somewhat difficult      Review of Systems:   Pertinent items are noted in HPI  Denies any headaches, blurred vision, fatigue, shortness of breath, chest pain, abdominal pain,  abnormal vaginal discharge/itching/odor/irritation, problems with periods, bowel movements, urination, or intercourse unless otherwise stated above. Pertinent History Reviewed:  Reviewed past medical,surgical, social and family history.   Reviewed problem list, medications and allergies. Physical Assessment:   Vitals:   04/14/23 1007  BP: 125/86  Pulse: 68  Weight: 194 lb (88 kg)  Height: 5\' 9"  (1.753 m)  Body mass index is 28.65 kg/m.        Physical Examination:   General appearance - well appearing, and in no distress  Mental status - alert, oriented to person, place, and time  Psych:  She has a normal mood and affect  Skin - warm and dry, inner thigh erythematous rash with some mild skin lightening and skin thickening  Chest - effort normal, all lung fields clear to auscultation bilaterally  Heart - normal rate and regular rhythm  Neck:  midline trachea, no thyromegaly or nodules  Breasts - breasts appear normal, no suspicious masses, no skin or nipple changes or  axillary nodes  Abdomen - soft, nontender, nondistended, no masses or organomegaly  Pelvic - VULVA: normal appearing vulva with no masses, tenderness or lesions   VAGINA: normal appearing vagina with normal  color and discharge, no lesions   CERVIX: normal appearing cervix without discharge or lesions, no CMT  Thin prep pap not indicated  UTERUS: uterus is felt to be normal size, shape, consistency and nontender   ADNEXA: No adnexal masses or tenderness noted.  Rectal - normal rectal, good sphincter tone, no masses felt.  Extremities:  No swelling or varicosities noted  Raechel Ache, RN, chaperone present for exam  Assessment & Plan:  1. Well woman exam without gynecological exam (Primary) - Pap smear 2023.  Will repeat next year. - Mammogram 12/2022 - Colonoscopy guidelines reviewed. - lab work ordered today - vaccines reviewed/updated  2. Skin rash - fluconazole (DIFLUCAN) 150 MG tablet; Take 1 and repeat in  72 hours  Dispense: 1 tablet; Refill: 0 - nystatin cream (MYCOSTATIN); Apply 1 Application topically 2 (two) times daily. Apply to affected area BID for up to 7 days.  Dispense: 30 g; Refill: 0  3. Elevated LDL cholesterol level - Comprehensive metabolic panel - Hemoglobin A1c - Lipid panel  4. History of abnormal cervical Pap smear - in 2011 or 2012.  Treated with cryotherapy.  Follow up negative.      Meds:  Meds ordered this encounter  Medications   fluconazole (DIFLUCAN) 150 MG tablet    Sig: Take 1 and repeat in 72 hours    Dispense:  1 tablet    Refill:  0   nystatin cream (MYCOSTATIN)    Sig: Apply 1 Application topically 2 (two) times daily. Apply to affected area BID for up to 7 days.    Dispense:  30 g    Refill:  0    Follow-up: Return in about 1 year (around 04/13/2024).  Jerene Bears, MD 04/14/2023 10:48 AM

## 2023-04-15 ENCOUNTER — Other Ambulatory Visit (HOSPITAL_BASED_OUTPATIENT_CLINIC_OR_DEPARTMENT_OTHER): Payer: Self-pay | Admitting: *Deleted

## 2023-04-15 DIAGNOSIS — B360 Pityriasis versicolor: Secondary | ICD-10-CM

## 2023-04-15 MED ORDER — FLUCONAZOLE 150 MG PO TABS: ORAL_TABLET | ORAL | 0 refills | Status: DC

## 2023-04-15 NOTE — Progress Notes (Signed)
 [facility] pharmacy called to request that additional rx be sent for diflucan. Order was to repeat in 72 hours but only 1 tablet dispensed. Reorder sent.

## 2023-04-16 ENCOUNTER — Encounter (HOSPITAL_BASED_OUTPATIENT_CLINIC_OR_DEPARTMENT_OTHER): Payer: Self-pay | Admitting: Obstetrics & Gynecology

## 2023-04-17 ENCOUNTER — Encounter (HOSPITAL_BASED_OUTPATIENT_CLINIC_OR_DEPARTMENT_OTHER): Payer: Self-pay | Admitting: Obstetrics & Gynecology

## 2023-07-08 ENCOUNTER — Other Ambulatory Visit (HOSPITAL_BASED_OUTPATIENT_CLINIC_OR_DEPARTMENT_OTHER): Payer: Self-pay | Admitting: *Deleted

## 2023-07-08 ENCOUNTER — Other Ambulatory Visit (HOSPITAL_BASED_OUTPATIENT_CLINIC_OR_DEPARTMENT_OTHER): Payer: Self-pay | Admitting: Obstetrics & Gynecology

## 2023-07-08 ENCOUNTER — Encounter (HOSPITAL_BASED_OUTPATIENT_CLINIC_OR_DEPARTMENT_OTHER): Payer: Self-pay | Admitting: Obstetrics & Gynecology

## 2023-07-08 DIAGNOSIS — B360 Pityriasis versicolor: Secondary | ICD-10-CM

## 2023-07-08 MED ORDER — NYSTATIN 100000 UNIT/GM EX CREA: 1.0000 | TOPICAL_CREAM | Freq: Two times a day (BID) | CUTANEOUS | 0 refills | Status: DC

## 2023-07-08 MED ORDER — FLUCONAZOLE 150 MG PO TABS: ORAL_TABLET | ORAL | 0 refills | Status: DC

## 2023-07-22 ENCOUNTER — Telehealth: Admitting: Physician Assistant

## 2023-07-22 DIAGNOSIS — R3989 Other symptoms and signs involving the genitourinary system: Secondary | ICD-10-CM | POA: Diagnosis not present

## 2023-07-22 MED ORDER — CEPHALEXIN 500 MG PO CAPS: 500.0000 mg | ORAL_CAPSULE | Freq: Two times a day (BID) | ORAL | 0 refills | Status: AC

## 2023-07-22 NOTE — Progress Notes (Signed)
 Virtual Visit Consent   Kristin Buckley, you are scheduled for a virtual visit with a [facility] provider today. Just as with appointments in the office, your consent must be obtained to participate. Your consent will be active for this visit and any virtual visit you may have with one of our providers in the next 365 days. If you have a MyChart account, a copy of this consent can be sent to you electronically.  As this is a virtual visit, video technology does not allow for your provider to perform a traditional examination. This may limit your provider's ability to fully assess your condition. If your provider identifies any concerns that need to be evaluated in person or the need to arrange testing (such as labs, EKG, etc.), we will make arrangements to do so. Although advances in technology are sophisticated, we cannot ensure that it will always work on either your end or our end. If the connection with a video visit is poor, the visit may have to be switched to a telephone visit. With either a video or telephone visit, we are not always able to ensure that we have a secure connection.  By engaging in this virtual visit, you consent to the provision of healthcare and authorize for your insurance to be billed (if applicable) for the services provided during this visit. Depending on your insurance coverage, you may receive a charge related to this service.  I need to obtain your verbal consent now. Are you willing to proceed with your visit today? Kristin Buckley has provided verbal consent on 07/22/2023 for a virtual visit (video or telephone). Kristin Buckley, [place]  Date: 07/22/2023 8:23 AM   Virtual Visit via Video Note   I, Kristin Buckley, connected with  Kristin Buckley  ([id number], Jul 05, 1980) on 07/22/23 at  8:30 AM EDT by a video-enabled telemedicine application and verified that I am speaking with the correct person using two identifiers.  Location: Patient: Virtual Visit  Location Patient: Home Provider: Virtual Visit Location Provider: Home Office   I discussed the limitations of evaluation and management by telemedicine and the availability of in person appointments. The patient expressed understanding and agreed to proceed.    History of Present Illness: Kristin Buckley is a 43 y.o. who identifies as a female who was assigned female at birth, and is being seen today for possible UTI. Endorses symptoms onset last night with urgency, frequency and hesitancy. Noting pink-tinged urine and mild dysuria. Denies fever, chills, nausea/vomiting. Denies back or belly pain. LMP -- ended 2 weeks ago. No concern for pregnancy.     HPI: HPI  Problems:  Patient Active Problem List   Diagnosis Date Noted   Tinea versicolor 06/28/2022   PMDD (premenstrual dysphoric disorder) 03/05/2019   Hx of abnormal Pap smear 05/04/2012   Hematuria 05/04/2012   Irregular menses 05/04/2012    Allergies:  Allergies  Allergen Reactions   Celexa [Citalopram Hydrobromide] Nausea Only   Medications:  Current Outpatient Medications:    cephALEXin (KEFLEX) 500 MG capsule, Take 1 capsule (500 mg total) by mouth 2 (two) times daily for 7 days., Disp: 14 capsule, Rfl: 0   SEMAGLUTIDE PO, Take by mouth. compounded, Disp: , Rfl:   Observations/Objective: Patient is well-developed, well-nourished in no acute distress.  Resting comfortably at home.  Head is normocephalic, atraumatic.  No labored breathing.  Speech is clear and coherent with logical content.  Patient is alert and oriented at baseline.   Assessment  and Plan: 1. Suspected UTI (Primary) - cephALEXin (KEFLEX) 500 MG capsule; Take 1 capsule (500 mg total) by mouth 2 (two) times daily for 7 days.  Dispense: 14 capsule; Refill: 0  Classic UTI symptoms with absence of alarm signs or symptoms. Prior history of UTI. Will treat empirically with Keflex for suspected uncomplicated cystitis. Supportive measures and OTC medications  reviewed. Strict in-person evaluation precautions discussed.    Follow Up Instructions: I discussed the assessment and treatment plan with the patient. The patient was provided an opportunity to ask questions and all were answered. The patient agreed with the plan and demonstrated an understanding of the instructions.  A copy of instructions were sent to the patient via MyChart unless otherwise noted below.    The patient was advised to call back or seek an in-person evaluation if the symptoms worsen or if the condition fails to improve as anticipated.    Kristin Maillard, PA-C

## 2023-07-22 NOTE — Patient Instructions (Signed)
 Kristin Buckley, thank you for joining Hyla Maillard, PA-C for today's virtual visit.  While this provider is not your primary care provider (PCP), if your PCP is located in our provider database this encounter information will be shared with them immediately following your visit.   A [facility] MyChart account gives you access to today's visit and all your visits, tests, and labs performed at [facility] " click here if you don't have a [facility] MyChart account or go to [email or web address]  Consent: (Patient) Kristin Buckley provided verbal consent for this virtual visit at the beginning of the encounter.  Current Medications:  Current Outpatient Medications:    fluconazole  (DIFLUCAN ) 150 MG tablet, Take 1 and repeat in 72 hours, Disp: 1 tablet, Rfl: 0   nystatin  cream (MYCOSTATIN ), Apply 1 Application topically 2 (two) times daily. Apply to affected area BID for up to 7 days., Disp: 30 g, Rfl: 0   SEMAGLUTIDE PO, Take by mouth. compounded, Disp: , Rfl:    Medications ordered in this encounter:  No orders of the defined types were placed in this encounter.    *If you need refills on other medications prior to your next appointment, please contact your pharmacy*  Follow-Up: Call back or seek an in-person evaluation if the symptoms worsen or if the condition fails to improve as anticipated.  [facility] [phone]  Other Instructions Your symptoms are consistent with a bladder infection, also called acute cystitis. Please take your antibiotic (Keflex) as directed until all pills are gone.  Stay very well hydrated.  Consider a daily probiotic (Align, Culturelle, or Activia) to help prevent stomach upset caused by the antibiotic.  Taking a probiotic daily may also help prevent recurrent UTIs.  Also consider taking AZO (Phenazopyridine) tablets to help decrease pain with urination.    Urinary Tract Infection A urinary tract infection (UTI) can  occur any place along the urinary tract. The tract includes the kidneys, ureters, bladder, and urethra. A type of germ called bacteria often causes a UTI. UTIs are often helped with antibiotic medicine.  HOME CARE  If given, take antibiotics as told by your doctor. Finish them even if you start to feel better. Drink enough fluids to keep your pee (urine) clear or pale yellow. Avoid tea, drinks with caffeine, and bubbly (carbonated) drinks. Pee often. Avoid holding your pee in for a long time. Pee before and after having sex (intercourse). Wipe from front to back after you poop (bowel movement) if you are a woman. Use each tissue only once. GET HELP RIGHT AWAY IF:  You have back pain. You have lower belly (abdominal) pain. You have chills. You feel sick to your stomach (nauseous). You throw up (vomit). Your burning or discomfort with peeing does not go away. You have a fever. Your symptoms are not better in 3 days. MAKE SURE YOU:  Understand these instructions. Will watch your condition. Will get help right away if you are not doing well or get worse. Document Released: 07/17/2007 Document Revised: 10/23/2011 Document Reviewed: 08/29/2011 [facility] Patient Information 2015 [place], [place]. This information is not intended to replace advice given to you by your health care provider. Make sure you discuss any questions you have with your health care provider.    If you have been instructed to have an in-person evaluation today at a local Urgent Care facility, please use the link below. It will take you to a list of all of our available [facility], including address, phone number and hours of operation. Please do not delay care.  [facility]  If you or a family member do not have a primary care provider, use the link below to schedule a visit and establish care. When you choose a [facility] primary care physician or advanced practice provider, you gain a long-term  partner in health. Find a Primary Care Provider  Learn more about [facility]'s in-office and virtual care options: [facility] - Get Care Now

## 2023-09-18 ENCOUNTER — Telehealth (HOSPITAL_BASED_OUTPATIENT_CLINIC_OR_DEPARTMENT_OTHER): Payer: Self-pay | Admitting: Obstetrics & Gynecology

## 2023-09-18 NOTE — Telephone Encounter (Signed)
 New message  The patient called C/o UTI.   Last office visit 04/2023.  Unable to come in for urine sample due to working 12 hour shift.

## 2023-09-20 ENCOUNTER — Telehealth: Admitting: Nurse Practitioner

## 2023-09-20 DIAGNOSIS — R3989 Other symptoms and signs involving the genitourinary system: Secondary | ICD-10-CM

## 2023-09-20 MED ORDER — NITROFURANTOIN MONOHYD MACRO 100 MG PO CAPS: 100.0000 mg | ORAL_CAPSULE | Freq: Two times a day (BID) | ORAL | 0 refills | Status: AC

## 2023-09-20 NOTE — Patient Instructions (Signed)
  Kristin Buckley, thank you for joining Haze LELON Servant, NP for today's virtual visit.  While this provider is not your primary care provider (PCP), if your PCP is located in our provider database this encounter information will be shared with them immediately following your visit.   A [facility] MyChart account gives you access to today's visit and all your visits, tests, and labs performed at [facility]  click here if you don't have a [facility] MyChart account or go to [email or web address]  Consent: (Patient) Kristin Buckley provided verbal consent for this virtual visit at the beginning of the encounter.  Current Medications:  Current Outpatient Medications:    nitrofurantoin , macrocrystal-monohydrate, (MACROBID ) 100 MG capsule, Take 1 capsule (100 mg total) by mouth 2 (two) times daily for 5 days., Disp: 10 capsule, Rfl: 0   SEMAGLUTIDE PO, Take by mouth. compounded, Disp: , Rfl:    Medications ordered in this encounter:  Meds ordered this encounter  Medications   nitrofurantoin , macrocrystal-monohydrate, (MACROBID ) 100 MG capsule    Sig: Take 1 capsule (100 mg total) by mouth 2 (two) times daily for 5 days.    Dispense:  10 capsule    Refill:  0    Supervising Provider:   BLAISE ALEENE KIDD [[id number]]     *If you need refills on other medications prior to your next appointment, please contact your pharmacy*  Follow-Up: Call back or seek an in-person evaluation if the symptoms worsen or if the condition fails to improve as anticipated.  [facility] [phone]    If you have been instructed to have an in-person evaluation today at a local Urgent Care facility, please use the link below. It will take you to a list of all of our available [facility], including address, phone number and hours of operation. Please do not delay care.  [facility]  If you or a family member do not have a primary care provider, use the  link below to schedule a visit and establish care. When you choose a [facility] primary care physician or advanced practice provider, you gain a long-term partner in health. Find a Primary Care Provider  Learn more about [facility]'s in-office and virtual care options:  - Get Care Now

## 2023-09-20 NOTE — Progress Notes (Signed)
 Virtual Visit Consent   Kristin Buckley, you are scheduled for a virtual visit with a [facility] provider today. Just as with appointments in the office, your consent must be obtained to participate. Your consent will be active for this visit and any virtual visit you may have with one of our providers in the next 365 days. If you have a MyChart account, a copy of this consent can be sent to you electronically.  As this is a virtual visit, video technology does not allow for your provider to perform a traditional examination. This may limit your provider's ability to fully assess your condition. If your provider identifies any concerns that need to be evaluated in person or the need to arrange testing (such as labs, EKG, etc.), we will make arrangements to do so. Although advances in technology are sophisticated, we cannot ensure that it will always work on either your end or our end. If the connection with a video visit is poor, the visit may have to be switched to a telephone visit. With either a video or telephone visit, we are not always able to ensure that we have a secure connection.  By engaging in this virtual visit, you consent to the provision of healthcare and authorize for your insurance to be billed (if applicable) for the services provided during this visit. Depending on your insurance coverage, you may receive a charge related to this service.  I need to obtain your verbal consent now. Are you willing to proceed with your visit today? Kristin Buckley has provided verbal consent on 09/20/2023 for a virtual visit (video or telephone). Kristin LELON Servant, NP  Date: 09/20/2023 1:23 PM   Virtual Visit via Video Note   I, Kristin Buckley, connected with  Kristin Buckley  ([id number], 11-17-1980) on 09/20/23 at  1:15 PM EDT by a video-enabled telemedicine application and verified that I am speaking with the correct person using two identifiers.  Location: Patient: Virtual Visit Location Patient:  Home Provider: Virtual Visit Location Provider: Home Office   I discussed the limitations of evaluation and management by telemedicine and the availability of in person appointments. The patient expressed understanding and agreed to proceed.    History of Present Illness: Kristin Buckley is a 43 y.o. who identifies as a female who was assigned female at birth, and is being seen today for UTI symptoms.  [name] just returned from the beach in [place]  earlier this week as well as working 312-hour shifts with inability for bathroom breaks and feels like she is getting another UTI.  Current symptoms include urinary urgency/frequency and dysuria.  She denies fever, flank pain or hematuria.      Problems:  Patient Active Problem List   Diagnosis Date Noted   Tinea versicolor 06/28/2022   PMDD (premenstrual dysphoric disorder) 03/05/2019   Hx of abnormal Pap smear 05/04/2012   Hematuria 05/04/2012   Irregular menses 05/04/2012    Allergies:  Allergies  Allergen Reactions   Celexa [Citalopram Hydrobromide] Nausea Only   Medications:  Current Outpatient Medications:    nitrofurantoin , macrocrystal-monohydrate, (MACROBID ) 100 MG capsule, Take 1 capsule (100 mg total) by mouth 2 (two) times daily for 5 days., Disp: 10 capsule, Rfl: 0   SEMAGLUTIDE PO, Take by mouth. compounded, Disp: , Rfl:   Observations/Objective: Patient is well-developed, well-nourished in no acute distress.  Resting comfortably at home.  Head is normocephalic, atraumatic.  No labored breathing.  Speech is clear and coherent with logical  content.  Patient is alert and oriented at baseline.    Assessment and Plan: 1. Suspected UTI (Primary) - nitrofurantoin , macrocrystal-monohydrate, (MACROBID ) 100 MG capsule; Take 1 capsule (100 mg total) by mouth 2 (two) times daily for 5 days.  Dispense: 10 capsule; Refill: 0   Follow Up Instructions: I discussed the assessment and treatment plan with the patient. The  patient was provided an opportunity to ask questions and all were answered. The patient agreed with the plan and demonstrated an understanding of the instructions.  A copy of instructions were sent to the patient via MyChart unless otherwise noted below.    The patient was advised to call back or seek an in-person evaluation if the symptoms worsen or if the condition fails to improve as anticipated.    Markeita Alicia W Antoney Biven, NP

## 2023-11-24 ENCOUNTER — Other Ambulatory Visit: Payer: Self-pay | Admitting: Obstetrics & Gynecology

## 2023-11-24 DIAGNOSIS — Z1231 Encounter for screening mammogram for malignant neoplasm of breast: Secondary | ICD-10-CM

## 2023-12-31 ENCOUNTER — Ambulatory Visit
Admission: RE | Admit: 2023-12-31 | Discharge: 2023-12-31 | Disposition: A | Discharge status: Home / Self Care | Source: Ambulatory Visit | Attending: Obstetrics & Gynecology | Admitting: Obstetrics & Gynecology

## 2023-12-31 DIAGNOSIS — Z1231 Encounter for screening mammogram for malignant neoplasm of breast: Secondary | ICD-10-CM

## 2024-03-04 ENCOUNTER — Telehealth: Admitting: Family

## 2024-03-04 DIAGNOSIS — B372 Candidiasis of skin and nail: Secondary | ICD-10-CM

## 2024-03-04 MED ORDER — NYSTATIN 100000 UNIT/GM EX CREA: 1.0000 | TOPICAL_CREAM | Freq: Two times a day (BID) | CUTANEOUS | 1 refills | Status: AC

## 2024-03-04 MED ORDER — FLUCONAZOLE 150 MG PO TABS: 150.0000 mg | ORAL_TABLET | ORAL | 0 refills | Status: AC | PRN

## 2024-03-04 NOTE — Patient Instructions (Signed)
Skin Yeast Infection  A skin yeast infection is a condition in which there is an overgrowth of yeast (Candida) that normally lives on the skin. This condition usually occurs in areas of the skin that are constantly warm and moist, such as the skin under the breasts or armpits, or in the groin and other body folds. What are the causes? This condition is caused by a change in the normal balance of the yeast that live on the skin. What increases the risk? You are more likely to develop this condition if you: Are obese. Are pregnant. Are 44 years of age or older. Wear tight clothing. Have any of the following conditions: Diabetes. Malnutrition. A weak body defense system (immune system). Take medicines such as: Birth control pills. Antibiotics. Steroid medicines. What are the signs or symptoms? The most common symptom of this condition is itchiness in the affected area. Other symptoms include: A red, swollen area of the skin. Bumps on the skin. How is this diagnosed? This condition is diagnosed with a medical history and physical exam. Your health care provider may check for yeast by taking scrapings of the skin to be viewed under a microscope. How is this treated? This condition is treated with medicine. Medicines may be prescribed or available over the counter. The medicines may be: Taken by mouth (orally). Applied as a cream or powder to your skin. Follow these instructions at home:  Take or apply over-the-counter and prescription medicines only as told by your health care provider. Maintain a healthy weight. If you need help losing weight, talk with your health care provider. Keep your skin clean and dry. Wear loose-fitting clothing. If you have diabetes, keep your blood sugar under control. Keep all follow-up visits. This is important. Contact a health care provider if: Your symptoms go away and then come back. Your symptoms do not get better with treatment. Your symptoms get  worse. Your rash spreads. You have a fever or chills. You have new symptoms. You have new warmth or redness of your skin. Your rash is painful or bleeding. Summary A skin yeast infection is a condition in which there is an overgrowth of yeast (Candida) that normally lives on the skin. Take or apply over-the-counter and prescription medicines only as told by your health care provider. Keep your skin clean and dry. Contact a health care provider if your symptoms do not get better with treatment. This information is not intended to replace advice given to you by your health care provider. Make sure you discuss any questions you have with your health care provider. Document Revised: 04/18/2020 Document Reviewed: 04/18/2020 Elsevier Patient Education  2024 [employer].

## 2024-03-04 NOTE — Progress Notes (Signed)
 " Virtual Visit Consent   Kristin Buckley, you are scheduled for a virtual visit with a [facility] provider today. Just as with appointments in the office, your consent must be obtained to participate. Your consent will be active for this visit and any virtual visit you may have with one of our providers in the next 365 days. If you have a MyChart account, a copy of this consent can be sent to you electronically.  As this is a virtual visit, video technology does not allow for your provider to perform a traditional examination. This may limit your provider's ability to fully assess your condition. If your provider identifies any concerns that need to be evaluated in person or the need to arrange testing (such as labs, EKG, etc.), we will make arrangements to do so. Although advances in technology are sophisticated, we cannot ensure that it will always work on either your end or our end. If the connection with a video visit is poor, the visit may have to be switched to a telephone visit. With either a video or telephone visit, we are not always able to ensure that we have a secure connection.  By engaging in this virtual visit, you consent to the provision of healthcare and authorize for your insurance to be billed (if applicable) for the services provided during this visit. Depending on your insurance coverage, you may receive a charge related to this service.  I need to obtain your verbal consent now. Are you willing to proceed with your visit today? Kristin Buckley has provided verbal consent on 03/04/2024 for a virtual visit (video or telephone). Bari Learn, FNP  Date: 03/04/2024 6:55 PM   Virtual Visit via Video Note   I, Bari Learn, connected with  Kristin Buckley  ([id number], [birth year]) on 03/04/24 at  7:45 PM EST by a video-enabled telemedicine application and verified that I am speaking with the correct person using two identifiers.  Location: Patient: Virtual Visit Location Patient:  Home Provider: Virtual Visit Location Provider: Home Office   I discussed the limitations of evaluation and management by telemedicine and the availability of in person appointments. The patient expressed understanding and agreed to proceed.    History of Present Illness: Kristin Buckley is a 44 y.o. who identifies as a female who was assigned female at birth, and is being seen today for rash of her groin and legs that started over the weekend. She was in a bathing suite at [facility] lodge and sitting in a wet bathing  suite  alot. Had this happen before a year before and was given diflucan  and it went away.   HPI: Rash This is a new problem. The problem has been gradually worsening since onset. The affected locations include the groin. The rash is characterized by itchiness and burning.    Problems:  Patient Active Problem List   Diagnosis Date Noted   Tinea versicolor 06/28/2022   PMDD (premenstrual dysphoric disorder) 03/05/2019   Hx of abnormal Pap smear 05/04/2012   Hematuria 05/04/2012   Irregular menses 05/04/2012    Allergies: Allergies[1] Medications: Current Medications[2]  Observations/Objective: Patient is well-developed, well-nourished in no acute distress.  Resting comfortably  at home.  Head is normocephalic, atraumatic.  No labored breathing.  Speech is clear and coherent with logical content.  Patient is alert and oriented at baseline.  Rash in groin and upper posterior thighs erythemas   Assessment and Plan: 1. Yeast infection of the skin (Primary) -  fluconazole  (DIFLUCAN ) 150 MG tablet; Take 1 tablet (150 mg total) by mouth every three (3) days as needed.  Dispense: 3 tablet; Refill: 0 - nystatin  cream (MYCOSTATIN ); Apply 1 Application topically 2 (two) times daily.  Dispense: 60 g; Refill: 1  Keep clean and dry Avoid scratching  Apply nystatin  BID  Start diflucan  and repeat in 3 days Follow up if symptoms worsen or do not improve   Follow Up  Instructions: I discussed the assessment and treatment plan with the patient. The patient was provided an opportunity to ask questions and all were answered. The patient agreed with the plan and demonstrated an understanding of the instructions.  A copy of instructions were sent to the patient via MyChart unless otherwise noted below.     The patient was advised to call back or seek an in-person evaluation if the symptoms worsen or if the condition fails to improve as anticipated.    Bari Learn, FNP    [1]  Allergies Allergen Reactions   Celexa [Citalopram Hydrobromide] Nausea Only  [2]  Current Outpatient Medications:    fluconazole  (DIFLUCAN ) 150 MG tablet, Take 1 tablet (150 mg total) by mouth every three (3) days as needed., Disp: 3 tablet, Rfl: 0   nystatin  cream (MYCOSTATIN ), Apply 1 Application topically 2 (two) times daily., Disp: 60 g, Rfl: 1   SEMAGLUTIDE PO, Take by mouth. compounded, Disp: , Rfl:   "

## 2024-03-18 ENCOUNTER — Encounter (HOSPITAL_BASED_OUTPATIENT_CLINIC_OR_DEPARTMENT_OTHER): Payer: Self-pay

## 2024-04-15 ENCOUNTER — Ambulatory Visit (HOSPITAL_BASED_OUTPATIENT_CLINIC_OR_DEPARTMENT_OTHER): Admitting: Obstetrics & Gynecology

## 2024-04-21 ENCOUNTER — Ambulatory Visit (HOSPITAL_BASED_OUTPATIENT_CLINIC_OR_DEPARTMENT_OTHER): Admitting: Obstetrics & Gynecology
